# Patient Record
Sex: Male | Born: 1975 | Hispanic: No | Marital: Single | Smoking: Never smoker
Health system: Southern US, Community
[De-identification: ages and names within clinical notes are randomized; demographics above are authoritative.]

## PROBLEM LIST (undated history)

## (undated) DIAGNOSIS — K439 Ventral hernia without obstruction or gangrene: Secondary | ICD-10-CM

## (undated) DIAGNOSIS — K760 Fatty (change of) liver, not elsewhere classified: Secondary | ICD-10-CM

## (undated) DIAGNOSIS — M545 Low back pain, unspecified: Secondary | ICD-10-CM

## (undated) DIAGNOSIS — R109 Unspecified abdominal pain: Secondary | ICD-10-CM

## (undated) HISTORY — DX: Low back pain: M54.5

## (undated) HISTORY — DX: Ventral hernia without obstruction or gangrene: K43.9

## (undated) HISTORY — DX: Fatty (change of) liver, not elsewhere classified: K76.0

## (undated) HISTORY — DX: Unspecified abdominal pain: R10.9

## (undated) HISTORY — DX: Low back pain, unspecified: M54.50

---

## 1999-08-27 ENCOUNTER — Emergency Department (HOSPITAL_COMMUNITY): Admission: EM | Admit: 1999-08-27 | Discharge: 1999-08-27 | Payer: Self-pay | Admitting: Emergency Medicine

## 1999-09-24 ENCOUNTER — Emergency Department (HOSPITAL_COMMUNITY): Admission: EM | Admit: 1999-09-24 | Discharge: 1999-09-24 | Payer: Self-pay | Admitting: Emergency Medicine

## 2000-07-20 ENCOUNTER — Emergency Department (HOSPITAL_COMMUNITY): Admission: EM | Admit: 2000-07-20 | Discharge: 2000-07-20 | Payer: Self-pay | Admitting: Emergency Medicine

## 2002-05-21 ENCOUNTER — Emergency Department (HOSPITAL_COMMUNITY): Admission: EM | Admit: 2002-05-21 | Discharge: 2002-05-21 | Payer: Self-pay | Admitting: *Deleted

## 2002-05-28 ENCOUNTER — Emergency Department (HOSPITAL_COMMUNITY): Admission: EM | Admit: 2002-05-28 | Discharge: 2002-05-28 | Payer: Self-pay | Admitting: Emergency Medicine

## 2007-05-20 ENCOUNTER — Emergency Department (HOSPITAL_COMMUNITY): Admission: EM | Admit: 2007-05-20 | Discharge: 2007-05-20 | Payer: Self-pay | Admitting: Emergency Medicine

## 2007-05-27 ENCOUNTER — Emergency Department (HOSPITAL_COMMUNITY): Admission: EM | Admit: 2007-05-27 | Discharge: 2007-05-27 | Payer: Self-pay | Admitting: Emergency Medicine

## 2012-01-28 ENCOUNTER — Ambulatory Visit (INDEPENDENT_AMBULATORY_CARE_PROVIDER_SITE_OTHER): Payer: BC Managed Care – PPO

## 2012-01-28 ENCOUNTER — Other Ambulatory Visit: Payer: Self-pay | Admitting: Emergency Medicine

## 2012-01-28 ENCOUNTER — Ambulatory Visit (HOSPITAL_COMMUNITY)
Admission: RE | Admit: 2012-01-28 | Discharge: 2012-01-28 | Disposition: A | Discharge status: Home / Self Care | Payer: BC Managed Care – PPO | Source: Ambulatory Visit | Attending: Emergency Medicine | Admitting: Emergency Medicine

## 2012-01-28 DIAGNOSIS — K439 Ventral hernia without obstruction or gangrene: Secondary | ICD-10-CM | POA: Insufficient documentation

## 2012-01-28 DIAGNOSIS — R1084 Generalized abdominal pain: Secondary | ICD-10-CM

## 2012-01-28 DIAGNOSIS — K37 Unspecified appendicitis: Secondary | ICD-10-CM

## 2012-01-28 DIAGNOSIS — K7689 Other specified diseases of liver: Secondary | ICD-10-CM | POA: Insufficient documentation

## 2012-01-28 DIAGNOSIS — Z0000 Encounter for general adult medical examination without abnormal findings: Secondary | ICD-10-CM

## 2012-01-28 DIAGNOSIS — J9819 Other pulmonary collapse: Secondary | ICD-10-CM | POA: Insufficient documentation

## 2012-01-28 MED ORDER — IOHEXOL 300 MG/ML  SOLN
100.0000 mL | Freq: Once | INTRAMUSCULAR | Status: AC | PRN
  Administered 2012-01-28: 100 mL via INTRAVENOUS

## 2012-01-30 ENCOUNTER — Telehealth: Payer: Self-pay

## 2012-01-30 NOTE — Telephone Encounter (Signed)
Spoke with pts wife and explained results and instructions. Wife verbalized understanding.

## 2012-01-30 NOTE — Telephone Encounter (Signed)
Patient wife Theadora Rama is calling about her husband lab work would like for someone to call concerning the results.

## 2012-02-01 ENCOUNTER — Encounter (INDEPENDENT_AMBULATORY_CARE_PROVIDER_SITE_OTHER): Payer: Self-pay | Admitting: Surgery

## 2012-02-03 ENCOUNTER — Ambulatory Visit (INDEPENDENT_AMBULATORY_CARE_PROVIDER_SITE_OTHER): Payer: BC Managed Care – PPO | Admitting: Emergency Medicine

## 2012-02-03 DIAGNOSIS — E1165 Type 2 diabetes mellitus with hyperglycemia: Secondary | ICD-10-CM | POA: Insufficient documentation

## 2012-02-03 DIAGNOSIS — R1033 Periumbilical pain: Secondary | ICD-10-CM

## 2012-02-03 DIAGNOSIS — R1084 Generalized abdominal pain: Secondary | ICD-10-CM

## 2012-02-03 DIAGNOSIS — E782 Mixed hyperlipidemia: Secondary | ICD-10-CM

## 2012-02-03 DIAGNOSIS — K439 Ventral hernia without obstruction or gangrene: Secondary | ICD-10-CM

## 2012-02-03 DIAGNOSIS — K760 Fatty (change of) liver, not elsewhere classified: Secondary | ICD-10-CM

## 2012-02-03 LAB — POCT URINALYSIS DIPSTICK
Glucose, UA: 100
Leukocytes, UA: NEGATIVE
Nitrite, UA: NEGATIVE
Urobilinogen, UA: 0.2

## 2012-02-03 LAB — POCT CBC
Granulocyte percent: 50 %G (ref 37–80)
Hemoglobin: 16 g/dL (ref 14.1–18.1)
MID (cbc): 0.5 (ref 0–0.9)
MPV: 8.7 fL (ref 0–99.8)
POC MID %: 6.4 %M (ref 0–12)
Platelet Count, POC: 330 10*3/uL (ref 142–424)
RBC: 5.52 M/uL (ref 4.69–6.13)

## 2012-02-03 LAB — LIPID PANEL
HDL: 47 mg/dL (ref >39)
Total CHOL/HDL Ratio: 4.7 Ratio
Triglycerides: 105 mg/dL (ref <150)

## 2012-02-03 LAB — GLUCOSE, POCT (MANUAL RESULT ENTRY): POC Glucose: 224

## 2012-02-03 LAB — POCT UA - MICROSCOPIC ONLY: Crystals, Ur, HPF, POC: NEGATIVE

## 2012-02-03 NOTE — Patient Instructions (Addendum)
Diabetes, Type 2Place hyperlipidemia patient instructions here.  Diabetes is a long-lasting (chronic) disease. In type 2 diabetes, the pancreas does not make enough insulin (a hormone), and the body does not respond normally to the insulin that is made. This type of diabetes was also previously called adult-onset diabetes. It usually occurs after the age of 30, but it can occur at any age.  CAUSES  Type 2 diabetes happens because the pancreasis not making enough insulin or your body has trouble using the insulin that your pancreas does make properly. SYMPTOMS   Drinking more than usual.   Urinating more than usual.   Blurred vision.   Dry, itchy skin.   Frequent infections.   Feeling more tired than usual (fatigue).  DIAGNOSIS The diagnosis of type 2 diabetes is usually made by one of the following tests:  Fasting blood glucose test. You will not eat for at least 8 hours and then take a blood test.   Random blood glucose test. Your blood glucose (sugar) is checked at any time of the day regardless of when you ate.   Oral glucose tolerance test (OGTT). Your blood glucose is measured after you have not eaten (fasted) and then after you drink a glucose containing beverage.  TREATMENT   Healthy eating.   Exercise.   Medicine, if needed.   Monitoring blood glucose.   Seeing your caregiver regularly.  HOME CARE INSTRUCTIONS   Check your blood glucose at least once a day. More frequent monitoring may be necessary, depending on your medicines and on how well your diabetes is controlled. Your caregiver will advise you.   Take your medicine as directed by your caregiver.   Do not smoke.   Make wise food choices. Ask your caregiver for information. Weight loss can improve your diabetes.   Learn about low blood glucose (hypoglycemia) and how to treat it.   Get your eyes checked regularly.   Have a yearly physical exam. Have your blood pressure checked and your blood and urine  tested.   Wear a pendant or bracelet saying that you have diabetes.   Check your feet every night for cuts, sores, blisters, and redness. Let your caregiver know if you have any problems.  SEEK MEDICAL CARE IF:   You have problems keeping your blood glucose in target range.   You have problems with your medicines.   You have symptoms of an illness that do not improve after 24 hours.  You have a sore or wound that is not healing. Cholesterol Cholesterol is a white, waxy, fat-like protein needed by your body in small amounts. The liver makes all the cholesterol you need. It is carried from the liver by the blood through the blood vessels. Deposits (plaque) may build up on blood vessel walls. This makes the arteries narrower and stiffer. Plaque increases the risk for heart attack and stroke. You cannot feel your cholesterol level even if it is very high. The only way to know is by a blood test to check your lipid (fats) levels. Once you know your cholesterol levels, you should keep a record of the test results. Work with your caregiver to to keep your levels in the desired range. WHAT THE RESULTS MEAN: Total cholesterol is a rough measure of all the cholesterol in your blood.  LDL is the so-called bad cholesterol. This is the type that deposits cholesterol in the walls of the arteries. You want this level to be low.  HDL is the good cholesterol  because it cleans the arteries and carries the LDL away. You want this level to be high.  Triglycerides are fat that the body can either burn for energy or store. High levels are closely linked to heart disease.  DESIRED LEVELS: Total cholesterol below 200.  LDL below 100 for people at risk, below 70 for very high risk.  HDL above 50 is good, above 60 is best.  Triglycerides below 150.  HOW TO LOWER YOUR CHOLESTEROL: Diet.  Choose fish or white meat chicken and [place], roasted or baked. Limit fatty cuts of red meat, fried foods, and processed meats,  such as sausage and lunch meat.  Eat lots of fresh fruits and vegetables. Choose whole grains, beans, pasta, potatoes and cereals.  Use only small amounts of olive, corn or canola oils. Avoid butter, mayonnaise, shortening or palm kernel oils. Avoid foods with trans-fats.  Use skim/nonfat milk and low-fat/nonfat yogurt and cheeses. Avoid whole milk, cream, ice cream, egg yolks and cheeses. Healthy desserts include angel food cake, ginger snaps, animal crackers, hard candy, popsicles, and low-fat/nonfat frozen yogurt. Avoid pastries, cakes, pies and cookies.  Exercise.  A regular program helps decrease LDL and raises HDL.  Helps with weight control.  Do things that increase your activity level like gardening, walking, or taking the stairs.  Medication.  May be prescribed by your caregiver to help lowering cholesterol and the risk for heart disease.  You may need medicine even if your levels are normal if you have several risk factors.  HOME CARE INSTRUCTIONS  Follow your diet and exercise programs as suggested by your caregiver.  Take medications as directed.  Have blood work done when your caregiver feels it is necessary.  MAKE SURE YOU:  Understand these instructions.  Will watch your condition.  Will get help right away if you are not doing well or get worse.  Document Released: 09/11/2001 Document Revised: 08/29/2011 Document Reviewed: 03/03/2008  [facility] Patient Information 2012 [place], [place].  You notice a change in vision or a new problem with your vision.   You have a fever.  MAKE SURE YOU:  Understand these instructions.   Will watch your condition.   Will get help right away if you are not doing well or get worse.  Document Released: 12/17/2005 Document Revised: 08/30/2011 Document Reviewed: 06/04/2011 [facility] Patient Information 2012 [place], [place].Place hyperlipidemia patient instructions here.

## 2012-02-03 NOTE — Progress Notes (Signed)
  Subjective:    Patient ID: Dennis Thomas, male    DOB: [birth year], 36 y.o.   MRN: [id number]  HPIThe patient enters for followup of ventral hernia and recent diagnosis of diabetes    Review of Systems  Constitutional: Negative.   Respiratory: Negative.   Cardiovascular: Positive for chest pain.  Gastrointestinal: Negative for abdominal pain, blood in stool and abdominal distention.  No vision changes positive urinary frequency and fatigue     Objective:   Physical Exam  Cardiovascular: Normal rate.   Pulmonary/Chest: Effort normal.  Abdominal: Soft. He exhibits no distension and no mass. There is no tenderness. There is no rebound and no guarding.          Assessment & Plan:  Here to f/u dm and hernia ventral and fatty change

## 2012-02-06 ENCOUNTER — Telehealth: Payer: Self-pay

## 2012-02-06 NOTE — Telephone Encounter (Signed)
.  [facility]   PT IS CALLING FOR LAB RESULTS  PLEASE CALL [phone]

## 2012-02-12 ENCOUNTER — Ambulatory Visit (INDEPENDENT_AMBULATORY_CARE_PROVIDER_SITE_OTHER): Payer: BC Managed Care – PPO | Admitting: Surgery

## 2012-02-12 ENCOUNTER — Encounter (INDEPENDENT_AMBULATORY_CARE_PROVIDER_SITE_OTHER): Payer: Self-pay | Admitting: Surgery

## 2012-02-12 ENCOUNTER — Telehealth: Payer: Self-pay

## 2012-02-12 VITALS — BP 120/84 | HR 76 | Temp 98.4°F | Resp 18 | Ht 67.0 in | Wt 219.2 lb

## 2012-02-12 DIAGNOSIS — K439 Ventral hernia without obstruction or gangrene: Secondary | ICD-10-CM | POA: Insufficient documentation

## 2012-02-12 NOTE — Progress Notes (Signed)
Patient ID: Dennis Thomas, male   DOB: 02/20/1976, 36 y.o.   MRN: [id number]  Chief Complaint  Patient presents with  . Hernia    new pt- eval ventral hernia    HPI Dennis Thomas is a 36 y.o. male.  Referred by Dr. Cleta Alberts for evaluation of a small supraumbilical ventral hernia HPI This is a 36 yo male who recently began experiencing some pain just above his umbilicus protruding to the right.  His wife has noticed a slight swelling in this area.  He recently had a physical examination which revealed a diagnosis of DM2.  Dr. Cleta Alberts obtained a CT scan which showed some fatty liver, as well as a small fat-containing ventral hernia just above the umbilicus.  He is referred for surgical evaluation. Past Medical History  Diagnosis Date  . Lower back pain   . Pain in the abdomen   . Diabetes mellitus   . Ventral hernia   . Steatosis of liver     History reviewed. No pertinent past surgical history.  History reviewed. No pertinent family history.  Social History History  Substance Use Topics  . Smoking status: Never Smoker   . Smokeless tobacco: Not on file  . Alcohol Use: No    No Known Allergies  Current Outpatient Prescriptions  Medication Sig Dispense Refill  . metFORMIN (GLUCOPHAGE) 500 MG tablet Take 500 mg by mouth daily with breakfast.        Review of Systems Review of Systems  Constitutional: Negative for fever, chills and unexpected weight change.  HENT: Negative for hearing loss, congestion, sore throat, trouble swallowing and voice change.   Eyes: Negative for visual disturbance.  Respiratory: Negative for cough and wheezing.   Cardiovascular: Negative for chest pain, palpitations and leg swelling.  Gastrointestinal: Positive for abdominal pain. Negative for nausea, vomiting, diarrhea, constipation, blood in stool, abdominal distention, anal bleeding and rectal pain.  Genitourinary: Negative for hematuria and difficulty urinating.  Musculoskeletal: Negative for  arthralgias.  Skin: Negative for rash and wound.  Neurological: Negative for seizures, syncope, weakness and headaches.  Hematological: Negative for adenopathy. Does not bruise/bleed easily.  Psychiatric/Behavioral: Negative for confusion.    Blood pressure 120/84, pulse 76, temperature 98.4 F (36.9 C), temperature source Temporal, resp. rate 18, height 5\' 7"  (1.702 m), weight 219 lb 3.2 oz (99.428 kg).  Physical Exam Physical Exam WDWN in NAD HEENT:  EOMI, sclera anicteric Neck:  No masses, no thyromegaly Lungs:  CTA bilaterally; normal respiratory effort CV:  Regular rate and rhythm; no murmurs Abd:  +bowel sounds, soft, non-tender, just above the umbilicus, there is a 2 cm palpable subcutaneous mass - mildly tender to palpation Ext:  Well-perfused; no edema Skin:  Warm, dry; no sign of jaundice  Data Reviewed CT scan  Assessment    Small supraumbilical ventral hernia    Plan    Ventral hernia repair, possibly with mesh.  The surgical procedure has been discussed with the patient.  Potential risks, benefits, alternative treatments, and expected outcomes have been explained.  All of the patient's questions at this time have been answered.  The likelihood of reaching the patient's treatment goal is good.  The patient understand the proposed surgical procedure and wishes to proceed.        Rayssa Atha K. 02/12/2012, 5:11 PM

## 2012-02-12 NOTE — Telephone Encounter (Signed)
Dr. Cleta Alberts- Pt had lab work done about 2 weeks ago and has not heard back about status of those. Went to another doctor and was told he had high cholesterol. Pt is very concerned about this.

## 2012-02-12 NOTE — Telephone Encounter (Signed)
Spoke with patients wife, per lab results we will get BS under better control, and recheck lipids in 3 mths.  If they are elevated at that time, will consider statin.  Wife understands.

## 2012-02-12 NOTE — Patient Instructions (Signed)
We will schedule your surgery today. 

## 2012-02-20 ENCOUNTER — Other Ambulatory Visit: Payer: Self-pay | Admitting: Physician Assistant

## 2012-02-20 HISTORY — PX: HERNIA REPAIR: SHX51

## 2012-03-12 ENCOUNTER — Encounter (INDEPENDENT_AMBULATORY_CARE_PROVIDER_SITE_OTHER): Payer: Self-pay

## 2012-03-21 ENCOUNTER — Encounter (HOSPITAL_COMMUNITY): Payer: Self-pay | Admitting: Pharmacy Technician

## 2012-03-21 ENCOUNTER — Other Ambulatory Visit (HOSPITAL_COMMUNITY): Payer: BC Managed Care – PPO

## 2012-03-24 ENCOUNTER — Other Ambulatory Visit: Payer: Self-pay

## 2012-03-24 ENCOUNTER — Encounter (HOSPITAL_COMMUNITY): Payer: Self-pay

## 2012-03-24 ENCOUNTER — Encounter (HOSPITAL_COMMUNITY)
Admission: RE | Admit: 2012-03-24 | Discharge: 2012-03-24 | Disposition: A | Discharge status: Home / Self Care | Payer: BC Managed Care – PPO | Source: Ambulatory Visit | Attending: Surgery | Admitting: Surgery

## 2012-03-24 LAB — BASIC METABOLIC PANEL
GFR calc Af Amer: >90 mL/min (ref >90)
GFR calc non Af Amer: >90 mL/min (ref >90)
Glucose, Bld: 128 mg/dL — ABNORMAL HIGH (ref 70–99)
Potassium: 4.1 mEq/L (ref 3.5–5.1)
Sodium: 138 mEq/L (ref 135–145)

## 2012-03-24 LAB — CBC
Hemoglobin: 16.1 g/dL (ref 13.0–17.0)
MCHC: 34.1 g/dL (ref 30.0–36.0)
RBC: 5.49 MIL/uL (ref 4.22–5.81)
WBC: 8.2 10*3/uL (ref 4.0–10.5)

## 2012-03-24 LAB — SURGICAL PCR SCREEN
MRSA, PCR: NEGATIVE
Staphylococcus aureus: POSITIVE — AB

## 2012-03-24 NOTE — Patient Instructions (Signed)
21 Dennis Thomas  03/24/2012   Your procedure is scheduled on:  03/26/12  Report to SHORT STAY DEPT  at 6:00 AM.  Call this number if you have problems the morning of surgery: [phone]   Remember:   Do not eat food or drink liquids AFTER MIDNIGHT    Take these medicines the morning of surgery with A SIP OF WATER: NONE   Do not wear jewelry, make-up or nail polish.  Do not wear lotions, powders, or perfumes.   Do not shave legs or underarms 48 hrs. before surgery (men may shave face)  Do not bring valuables to the hospital.  Contacts, dentures or bridgework may not be worn into surgery.  Leave suitcase in the car. After surgery it may be brought to your room.  For patients admitted to the hospital, checkout time is 11:00 AM the day of discharge.   Patients discharged the day of surgery will not be allowed to drive home.  Name and phone number of your driver:   Special Instructions:   Please read over the following fact sheets that you were given: MRSA  Information               SHOWER WITH BETASEPT THE NIGHT BEFORE SURGERY AND THE MORNING OF SURGERY            STOP ALL ASPIRIN AND HERBAL PRODUCTS 5 DAYS PREOP

## 2012-03-25 ENCOUNTER — Other Ambulatory Visit: Payer: Self-pay | Admitting: Physician Assistant

## 2012-03-25 ENCOUNTER — Other Ambulatory Visit (HOSPITAL_COMMUNITY): Payer: BC Managed Care – PPO

## 2012-03-25 NOTE — H&P (Signed)
Chief Complaint   Patient presents with   .  Hernia       new pt- eval ventral hernia        HPI Dennis Thomas is a 36 y.o. male.  Referred by Dr. Cleta Alberts for evaluation of a small supraumbilical ventral hernia HPI This is a 36 yo male who recently began experiencing some pain just above his umbilicus protruding to the right.  His wife has noticed a slight swelling in this area.  He recently had a physical examination which revealed a diagnosis of DM2.  Dr. Cleta Alberts obtained a CT scan which showed some fatty liver, as well as a small fat-containing ventral hernia just above the umbilicus.  He is referred for surgical evaluation. Past Medical History   Diagnosis  Date   .  Lower back pain     .  Pain in the abdomen     .  Diabetes mellitus     .  Ventral hernia     .  Steatosis of liver          History reviewed. No pertinent past surgical history.   History reviewed. No pertinent family history.   Social History History   Substance Use Topics   .  Smoking status:  Never Smoker    .  Smokeless tobacco:  Not on file   .  Alcohol Use:  No        No Known Allergies    Current Outpatient Prescriptions   Medication  Sig  Dispense  Refill   .  metFORMIN (GLUCOPHAGE) 500 MG tablet  Take 500 mg by mouth daily with breakfast.              Review of Systems Review of Systems  Constitutional: Negative for fever, chills and unexpected weight change.  HENT: Negative for hearing loss, congestion, sore throat, trouble swallowing and voice change.   Eyes: Negative for visual disturbance.  Respiratory: Negative for cough and wheezing.   Cardiovascular: Negative for chest pain, palpitations and leg swelling.  Gastrointestinal: Positive for abdominal pain. Negative for nausea, vomiting, diarrhea, constipation, blood in stool, abdominal distention, anal bleeding and rectal pain.  Genitourinary: Negative for hematuria and difficulty urinating.  Musculoskeletal: Negative for  arthralgias.  Skin: Negative for rash and wound.  Neurological: Negative for seizures, syncope, weakness and headaches.  Hematological: Negative for adenopathy. Does not bruise/bleed easily.  Psychiatric/Behavioral: Negative for confusion.      Blood pressure 120/84, pulse 76, temperature 98.4 F (36.9 C), temperature source Temporal, resp. rate 18, height 5\' 7"  (1.702 m), weight 219 lb 3.2 oz (99.428 kg).   Physical Exam Physical Exam WDWN in NAD HEENT:  EOMI, sclera anicteric Neck:  No masses, no thyromegaly Lungs:  CTA bilaterally; normal respiratory effort CV:  Regular rate and rhythm; no murmurs Abd:  +bowel sounds, soft, non-tender, just above the umbilicus, there is a 2 cm palpable subcutaneous mass - mildly tender to palpation Ext:  Well-perfused; no edema Skin:  Warm, dry; no sign of jaundice   Data Reviewed CT scan   Assessment    Small supraumbilical ventral hernia     Plan    Ventral hernia repair, possibly with mesh.  The surgical procedure has been discussed with the patient.  Potential risks, benefits, alternative treatments, and expected outcomes have been explained.  All of the patient's questions at this time have been answered.  The likelihood of reaching the patient's treatment goal is good.  The  patient understand the proposed surgical procedure and wishes to proceed.      Wilmon Arms. Corliss Skains, MD, [facility]  03/25/2012 9:41 PM

## 2012-03-26 ENCOUNTER — Encounter (HOSPITAL_COMMUNITY): Payer: Self-pay | Admitting: Anesthesiology

## 2012-03-26 ENCOUNTER — Encounter (HOSPITAL_COMMUNITY)
Admission: RE | Disposition: A | Discharge status: Home / Self Care | Payer: Self-pay | Source: Ambulatory Visit | Attending: Surgery

## 2012-03-26 ENCOUNTER — Encounter (HOSPITAL_COMMUNITY): Payer: Self-pay | Admitting: *Deleted

## 2012-03-26 ENCOUNTER — Ambulatory Visit (HOSPITAL_COMMUNITY): Payer: BC Managed Care – PPO | Admitting: Anesthesiology

## 2012-03-26 ENCOUNTER — Ambulatory Visit (HOSPITAL_COMMUNITY)
Admission: RE | Admit: 2012-03-26 | Discharge: 2012-03-26 | Disposition: A | Discharge status: Home / Self Care | Payer: BC Managed Care – PPO | Source: Ambulatory Visit | Attending: Surgery | Admitting: Surgery

## 2012-03-26 DIAGNOSIS — K439 Ventral hernia without obstruction or gangrene: Secondary | ICD-10-CM

## 2012-03-26 DIAGNOSIS — Z01812 Encounter for preprocedural laboratory examination: Secondary | ICD-10-CM | POA: Insufficient documentation

## 2012-03-26 DIAGNOSIS — E119 Type 2 diabetes mellitus without complications: Secondary | ICD-10-CM | POA: Insufficient documentation

## 2012-03-26 HISTORY — PX: VENTRAL HERNIA REPAIR: SHX424

## 2012-03-26 SURGERY — REPAIR, HERNIA, VENTRAL
Anesthesia: General | Wound class: Clean

## 2012-03-26 MED ORDER — OXYCODONE-ACETAMINOPHEN 5-325 MG PO TABS: 1.0000 | ORAL_TABLET | ORAL | Status: AC | PRN

## 2012-03-26 MED ORDER — ACETAMINOPHEN 10 MG/ML IV SOLN
INTRAVENOUS | Status: DC | PRN
  Administered 2012-03-26: 1000 mg via INTRAVENOUS

## 2012-03-26 MED ORDER — PROPOFOL 10 MG/ML IV EMUL
INTRAVENOUS | Status: DC | PRN
  Administered 2012-03-26: 140 mg via INTRAVENOUS

## 2012-03-26 MED ORDER — FENTANYL CITRATE 0.05 MG/ML IJ SOLN
INTRAMUSCULAR | Status: DC | PRN
  Administered 2012-03-26: 100 ug via INTRAVENOUS
  Administered 2012-03-26: 50 ug via INTRAVENOUS

## 2012-03-26 MED ORDER — DROPERIDOL 2.5 MG/ML IJ SOLN
INTRAMUSCULAR | Status: DC | PRN
  Administered 2012-03-26: 0.625 mg via INTRAVENOUS

## 2012-03-26 MED ORDER — OXYCODONE-ACETAMINOPHEN 5-325 MG PO TABS
ORAL_TABLET | ORAL | Status: AC
  Filled 2012-03-26: qty 1

## 2012-03-26 MED ORDER — LACTATED RINGERS IV SOLN: INTRAVENOUS | Status: DC

## 2012-03-26 MED ORDER — ACETAMINOPHEN 10 MG/ML IV SOLN
INTRAVENOUS | Status: AC
  Filled 2012-03-26: qty 100

## 2012-03-26 MED ORDER — BUPIVACAINE-EPINEPHRINE PF 0.25-1:200000 % IJ SOLN
INTRAMUSCULAR | Status: AC
  Filled 2012-03-26: qty 30

## 2012-03-26 MED ORDER — HYDROMORPHONE HCL PF 1 MG/ML IJ SOLN: 0.2500 mg | INTRAMUSCULAR | Status: DC | PRN

## 2012-03-26 MED ORDER — MIDAZOLAM HCL 5 MG/5ML IJ SOLN
INTRAMUSCULAR | Status: DC | PRN
  Administered 2012-03-26: 2 mg via INTRAVENOUS

## 2012-03-26 MED ORDER — BUPIVACAINE-EPINEPHRINE 0.25% -1:200000 IJ SOLN
INTRAMUSCULAR | Status: DC | PRN
  Administered 2012-03-26: 20 mL

## 2012-03-26 MED ORDER — OXYCODONE-ACETAMINOPHEN 5-325 MG PO TABS
1.0000 | ORAL_TABLET | ORAL | Status: DC | PRN
  Administered 2012-03-26: 1 via ORAL

## 2012-03-26 MED ORDER — CEFAZOLIN SODIUM 1-5 GM-% IV SOLN
INTRAVENOUS | Status: AC
  Filled 2012-03-26: qty 100

## 2012-03-26 MED ORDER — MORPHINE SULFATE 10 MG/ML IJ SOLN: 2.0000 mg | INTRAMUSCULAR | Status: DC | PRN

## 2012-03-26 MED ORDER — 0.9 % SODIUM CHLORIDE (POUR BTL) OPTIME
TOPICAL | Status: DC | PRN
  Administered 2012-03-26: 1000 mL

## 2012-03-26 MED ORDER — CEFAZOLIN SODIUM-DEXTROSE 2-3 GM-% IV SOLR
2.0000 g | INTRAVENOUS | Status: AC
  Administered 2012-03-26: 2 g via INTRAVENOUS

## 2012-03-26 MED ORDER — ONDANSETRON HCL 4 MG/2ML IJ SOLN
INTRAMUSCULAR | Status: DC | PRN
  Administered 2012-03-26: 4 mg via INTRAVENOUS

## 2012-03-26 MED ORDER — DEXAMETHASONE SODIUM PHOSPHATE 10 MG/ML IJ SOLN
INTRAMUSCULAR | Status: DC | PRN
  Administered 2012-03-26: 10 mg via INTRAVENOUS

## 2012-03-26 MED ORDER — ONDANSETRON HCL 4 MG/2ML IJ SOLN: 4.0000 mg | INTRAMUSCULAR | Status: DC | PRN

## 2012-03-26 MED ORDER — LACTATED RINGERS IV SOLN
INTRAVENOUS | Status: DC | PRN
  Administered 2012-03-26: 08:00:00 via INTRAVENOUS

## 2012-03-26 SURGICAL SUPPLY — 43 items
APL SKNCLS STERI-STRIP NONHPOA (GAUZE/BANDAGES/DRESSINGS) ×1
BENZOIN TINCTURE PRP APPL 2/3 (GAUZE/BANDAGES/DRESSINGS) ×1 IMPLANT
BINDER ABD UNIV 12 45-62 (WOUND CARE) IMPLANT
BINDER ABDOMINAL 46IN 62IN (WOUND CARE)
BLADE HEX COATED 2.75 (ELECTRODE) ×2 IMPLANT
CANISTER SUCTION 2500CC (MISCELLANEOUS) ×2 IMPLANT
CLOTH BEACON ORANGE TIMEOUT ST (SAFETY) ×2 IMPLANT
DECANTER SPIKE VIAL GLASS SM (MISCELLANEOUS) IMPLANT
DRAIN CHANNEL 10F 3/8 F FF (DRAIN) IMPLANT
DRAIN CHANNEL RND F F (WOUND CARE) IMPLANT
DRAPE LAPAROSCOPIC ABDOMINAL (DRAPES) ×2 IMPLANT
DRAPE UTILITY XL STRL (DRAPES) ×2 IMPLANT
DRSG TEGADERM 4X4.75 (GAUZE/BANDAGES/DRESSINGS) ×1 IMPLANT
ELECT REM PT RETURN 9FT ADLT (ELECTROSURGICAL) ×2
ELECTRODE REM PT RTRN 9FT ADLT (ELECTROSURGICAL) ×1 IMPLANT
EVACUATOR SILICONE 100CC (DRAIN) IMPLANT
GLOVE BIO SURGEON STRL SZ7 (GLOVE) ×2 IMPLANT
GLOVE BIOGEL PI IND STRL 7.0 (GLOVE) ×1 IMPLANT
GLOVE BIOGEL PI IND STRL 7.5 (GLOVE) ×1 IMPLANT
GLOVE BIOGEL PI INDICATOR 7.0 (GLOVE) ×1
GLOVE BIOGEL PI INDICATOR 7.5 (GLOVE) ×1
GOWN STRL NON-REIN LRG LVL3 (GOWN DISPOSABLE) ×3 IMPLANT
GOWN STRL REIN XL XLG (GOWN DISPOSABLE) ×2 IMPLANT
KIT BASIN OR (CUSTOM PROCEDURE TRAY) ×2 IMPLANT
NEEDLE HYPO 22GX1.5 SAFETY (NEEDLE) ×1 IMPLANT
PACK GENERAL/GYN (CUSTOM PROCEDURE TRAY) ×2 IMPLANT
SPONGE GAUZE 4X4 12PLY (GAUZE/BANDAGES/DRESSINGS) ×2 IMPLANT
STAPLER VISISTAT 35W (STAPLE) ×2 IMPLANT
STRIP CLOSURE SKIN 1/2X4 (GAUZE/BANDAGES/DRESSINGS) ×1 IMPLANT
SUT ETHILON 2 0 PS N (SUTURE) ×2 IMPLANT
SUT NOVA NAB GS-21 0 18 T12 DT (SUTURE) ×2 IMPLANT
SUT PDS AB 1 CTX 36 (SUTURE) IMPLANT
SUT PROLENE 0 CT 1 CR/8 (SUTURE) IMPLANT
SUT SILK 3 0 (SUTURE)
SUT SILK 3-0 18XBRD TIE 12 (SUTURE) ×1 IMPLANT
SUT VIC AB 2-0 CT1 27 (SUTURE) ×2
SUT VIC AB 2-0 CT1 27XBRD (SUTURE) ×1 IMPLANT
SUT VIC AB 3-0 SH 27 (SUTURE) ×2
SUT VIC AB 3-0 SH 27XBRD (SUTURE) ×1 IMPLANT
SYR CONTROL 10ML LL (SYRINGE) ×1 IMPLANT
TAPE CLOTH SURG 4X10 WHT LF (GAUZE/BANDAGES/DRESSINGS) ×1 IMPLANT
TOWEL OR 17X26 10 PK STRL BLUE (TOWEL DISPOSABLE) ×3 IMPLANT
TRAY FOLEY CATH 14FRSI W/METER (CATHETERS) IMPLANT

## 2012-03-26 NOTE — Interval H&P Note (Signed)
History and Physical Interval Note:  03/26/2012 8:31 AM  Dennis Thomas  has presented today for surgery, with the diagnosis of Small supraumbilical ventral hernia  The various methods of treatment have been discussed with the patient and family. After consideration of risks, benefits and other options for treatment, the patient has consented to  Procedure(s) (LRB): HERNIA REPAIR VENTRAL ADULT (N/A) INSERTION OF MESH (N/A) as a surgical intervention .  The patients' history has been reviewed, patient examined, no change in status, stable for surgery.  I have reviewed the patients' chart and labs.  Questions were answered to the patient's satisfaction.     Decorian Schuenemann K.

## 2012-03-26 NOTE — Op Note (Signed)
Ventral Hernia Repair Procedure Note  Indications: Symptomatic ventral hernia  Pre-operative Diagnosis: Ventral hernia  Post-operative Diagnosis: Ventral hernia  Surgeon: Elazar Argabright K.   Assistants: none  Anesthesia: General LMA anesthesia  ASA Class: 2   Procedure Details  The patient was seen in the Holding Room. The risks, benefits, complications, treatment options, and expected outcomes were discussed with the patient. The possibilities of reaction to medication, pulmonary aspiration, perforation of viscus, bleeding, recurrent infection, the need for additional procedures, failure to diagnose a condition, and creating a complication requiring transfusion or operation were discussed with the patient. The patient concurred with the proposed plan, giving informed consent.  The site of surgery properly noted/marked. The patient was taken to the operating room, identified as Dennis Thomas and the procedure verified as ventral hernia repair. A Time Out was held and the above information confirmed.  The patient was placed supine.  After establishing general anesthesia, the abdomen was prepped with Chloraprep and draped in sterile fashion.  We made a vertical incision over the palpable hernia which was just above the umbilicus.  The incision was about 3 cm long.  . Dissection was carried down to the hernia sac located above the fascia and mobilized from surrounding structures.  Intact fascia was identified circumferentially around the defect. The defect was only about 7 mm across.  We amputated the herniated preperitoneal fat. Since the defect was so small, we chose to primarily close this with sutures.  The fascial defect was reapproximated with interrupted figure-of-8  0 Novofil sutures.  The subcutaneous tissues were irrigated. We closed the subcutaneous tissues with 3-0 Vicryl and closed the incision with 4-0 Monocryl.  Steri-strips and clean dressings were applied.  Instrument, sponge, and  needle counts were correct prior to closure and at the conclusion of the case.   Findings: 7 mm ventral hernia defect  Estimated Blood Loss:  Minimal         Drains: none                      Complications:  None; patient tolerated the procedure well.         Disposition: PACU - hemodynamically stable.         Condition: stable   Wilmon Arms. Corliss Skains, MD, [facility]  03/26/2012 9:24 AM

## 2012-03-26 NOTE — Progress Notes (Signed)
Pt given ice pack at discharge 

## 2012-03-26 NOTE — Progress Notes (Signed)
Pt ambulated in hall to bathroom without any difficulty.  Pt voided clear yellow urine.

## 2012-03-26 NOTE — Discharge Instructions (Signed)
[facility] ° °VENTRAL HERNIA REPAIR: POST OP INSTRUCTIONS ° °Always review your discharge instruction sheet given to you by the facility where your surgery was performed. °IF YOU HAVE DISABILITY OR FAMILY LEAVE FORMS, YOU MUST BRING THEM TO THE OFFICE FOR PROCESSING.   °DO NOT GIVE THEM TO YOUR DOCTOR. ° °1. A  prescription for pain medication may be given to you upon discharge.  Take your pain medication as prescribed, if needed.  If narcotic pain medicine is not needed, then you may take acetaminophen (Tylenol) or ibuprofen (Advil) as needed. °2. Take your usually prescribed medications unless otherwise directed. °3. If you need a refill on your pain medication, please contact your pharmacy.  They will contact our office to request authorization. Prescriptions will not be filled after 5 pm or on week-ends. °4. You should follow a light diet the first 24 hours after arrival home, such as soup and crackers, etc.  Be sure to include lots of fluids daily.  Resume your normal diet the day after surgery. °5. Most patients will experience some swelling and bruising around the umbilicus or in the groin and scrotum.  Ice packs and reclining will help.  Swelling and bruising can take several days to resolve.  °6. It is common to experience some constipation if taking pain medication after surgery.  Increasing fluid intake and taking a stool softener (such as Colace) will usually help or prevent this problem from occurring.  A mild laxative (Milk of Magnesia or Miralax) should be taken according to package directions if there are no bowel movements after 48 hours. °7. Unless discharge instructions indicate otherwise, you may remove your bandages 24-48 hours after surgery, and you may shower at that time.  You will have steri-strips (small skin tapes) in place directly over the incision.  These strips should be left on the skin for 7-10 days. °8. ACTIVITIES:  You may resume regular (light) daily activities  beginning the next day--such as daily self-care, walking, climbing stairs--gradually increasing activities as tolerated.  You may have sexual intercourse when it is comfortable.  Refrain from any heavy lifting or straining until approved by your doctor. °a. You may drive when you are no longer taking prescription pain medication, you can comfortably wear a seatbelt, and you can safely maneuver your car and apply brakes. °b. RETURN TO WORK:  2-3 weeks with light duty - no lifting over 15 lbs. °9. You should see your doctor in the office for a follow-up appointment approximately 2-3 weeks after your surgery.  Make sure that you call for this appointment within a day or two after you arrive home to insure a convenient appointment time. °10. OTHER INSTRUCTIONS:  __________________________________________________________________________________________________________________________________________________________________________________________  °WHEN TO CALL YOUR DOCTOR: °1. Fever over 101.0 °2. Inability to urinate °3. Nausea and/or vomiting °4. Extreme swelling or bruising °5. Continued bleeding from incision. °6. Increased pain, redness, or drainage from the incision ° °The clinic staff is available to answer your questions during regular business hours.  Please don’t hesitate to call and ask to speak to one of the nurses for clinical concerns.  If you have a medical emergency, go to the nearest emergency room or call 911.  A surgeon from [facility] is always on call at the hospital ° ° °[address], [address], [place], [place]  [address] ? ° [address] [address], [place], [place]   [address] °[phone]    [phone]    FAX [phone] °Web site: [email or web address] ° ° °

## 2012-03-26 NOTE — Transfer of Care (Signed)
Immediate Anesthesia Transfer of Care Note  Patient: Dennis Thomas  Procedure(s) Performed: Procedure(s) (LRB): HERNIA REPAIR VENTRAL ADULT (N/A)  Patient Location: PACU  Anesthesia Type: General  Level of Consciousness: awake, sedated and patient cooperative  Airway & Oxygen Therapy: Patient Spontanous Breathing and Patient connected to face mask oxygen  Post-op Assessment: Report given to PACU RN and Post -op Vital signs reviewed and stable  Post vital signs: Reviewed and stable  Complications: No apparent anesthesia complications

## 2012-03-26 NOTE — Progress Notes (Signed)
Pt is resting comfortably. Pt is drinking fluids and eating crackers without difficulty.

## 2012-03-26 NOTE — Anesthesia Postprocedure Evaluation (Signed)
  Anesthesia Post-op Note  Patient: Dennis Thomas  Procedure(s) Performed: Procedure(s) (LRB): HERNIA REPAIR VENTRAL ADULT (N/A)  Patient Location: PACU  Anesthesia Type: General  Level of Consciousness: oriented and sedated  Airway and Oxygen Therapy: Patient Spontanous Breathing  Post-op Pain: mild  Post-op Assessment: Post-op Vital signs reviewed, Patient's Cardiovascular Status Stable, Respiratory Function Stable and Patent Airway  Post-op Vital Signs: stable  Complications: No apparent anesthesia complications

## 2012-03-26 NOTE — Anesthesia Preprocedure Evaluation (Signed)
Anesthesia Evaluation  Patient identified by MRN, date of birth, ID band Patient awake    Reviewed: Allergy & Precautions, H&P , NPO status , Patient's Chart, lab work & pertinent test results, reviewed documented beta blocker date and time   Airway Mallampati: II TM Distance: >3 FB Neck ROM: Full    Dental  (+) Dental Advisory Given and Teeth Intact   Pulmonary neg pulmonary ROS,  breath sounds clear to auscultation        Cardiovascular negative cardio ROS  Rhythm:Regular Rate:Normal  Denies cardiac symptoms   Neuro/Psych negative neurological ROS  negative psych ROS   GI/Hepatic negative GI ROS, Neg liver ROS,   Endo/Other  Diabetes mellitus-, Type 2, Oral Hypoglycemic AgentsFair control  Renal/GU negative Renal ROS  negative genitourinary   Musculoskeletal   Abdominal   Peds negative pediatric ROS (+)  Hematology negative hematology ROS (+)   Anesthesia Other Findings Upper Front cap  Reproductive/Obstetrics negative OB ROS                           Anesthesia Physical Anesthesia Plan  ASA: III  Anesthesia Plan: General   Post-op Pain Management:    Induction: Intravenous  Airway Management Planned: Oral ETT  Additional Equipment:   Intra-op Plan:   Post-operative Plan: Extubation in OR  Informed Consent: I have reviewed the patients History and Physical, chart, labs and discussed the procedure including the risks, benefits and alternatives for the proposed anesthesia with the patient or authorized representative who has indicated his/her understanding and acceptance.   Dental advisory given  Plan Discussed with: CRNA and Surgeon  Anesthesia Plan Comments:         Anesthesia Quick Evaluation

## 2012-03-28 ENCOUNTER — Encounter (HOSPITAL_COMMUNITY): Payer: Self-pay | Admitting: Surgery

## 2012-04-04 ENCOUNTER — Encounter (INDEPENDENT_AMBULATORY_CARE_PROVIDER_SITE_OTHER): Payer: BC Managed Care – PPO

## 2012-04-15 ENCOUNTER — Encounter (INDEPENDENT_AMBULATORY_CARE_PROVIDER_SITE_OTHER): Payer: Self-pay | Admitting: Surgery

## 2012-04-15 ENCOUNTER — Ambulatory Visit (INDEPENDENT_AMBULATORY_CARE_PROVIDER_SITE_OTHER): Payer: BC Managed Care – PPO | Admitting: Surgery

## 2012-04-15 VITALS — BP 118/75 | HR 82 | Temp 97.6°F | Resp 16 | Ht 67.0 in | Wt 210.6 lb

## 2012-04-15 DIAGNOSIS — K439 Ventral hernia without obstruction or gangrene: Secondary | ICD-10-CM

## 2012-04-15 NOTE — Progress Notes (Signed)
S/p open ventral hernia repair on 2/20. He had a 7 mm defect above his umbilicus that was repaired with interrupted suture.  He is doing well.  No sign of infection.  No recurrence.  He may resume full activity.  Follow-up PRN.  Wilmon Arms. Corliss Skains, MD, [facility]  04/15/2012 3:47 PM

## 2012-05-28 ENCOUNTER — Ambulatory Visit (INDEPENDENT_AMBULATORY_CARE_PROVIDER_SITE_OTHER): Payer: BC Managed Care – PPO | Admitting: Emergency Medicine

## 2012-05-28 DIAGNOSIS — E119 Type 2 diabetes mellitus without complications: Secondary | ICD-10-CM

## 2012-05-28 DIAGNOSIS — IMO0002 Reserved for concepts with insufficient information to code with codable children: Secondary | ICD-10-CM

## 2012-05-28 LAB — POCT GLYCOSYLATED HEMOGLOBIN (HGB A1C): Hemoglobin A1C: 6.7

## 2012-05-28 MED ORDER — DOXYCYCLINE HYCLATE 100 MG PO TABS: 100.0000 mg | ORAL_TABLET | Freq: Two times a day (BID) | ORAL | Status: AC

## 2012-05-28 MED ORDER — METFORMIN HCL 500 MG PO TABS: 500.0000 mg | ORAL_TABLET | Freq: Two times a day (BID) | ORAL | Status: DC

## 2012-05-28 NOTE — Progress Notes (Signed)
  Subjective:    Patient ID: Dennis Thomas, male    DOB: 06/23/1976, 36 y.o.   MRN: [id number]  HPI patient enters with a painful swollen nail on the middle finger of his left hand to [place] needle last night was able to obtain some purulent material. He is in today for followup on his diabetes    Review of Systems denies chest pain shortness of breath or other problems.     Objective:   Physical Exam HEENT exam is unremarkable. Neck supple chest clear. Heart regular bradycardia no murmurs. Examination of the left middle finger reveals redness at the base of the nail. The area was cleaned with alcohol a 20-gauge needle was used to unroofed the cuticle. Purulent material was obtained.  Results for orders placed in visit on 05/28/12  GLUCOSE, POCT (MANUAL RESULT ENTRY)      Component Value Range   POC Glucose 157 (*) 70 - 99 (mg/dl)  POCT GLYCOSYLATED HEMOGLOBIN (HGB A1C)      Component Value Range   Hemoglobin A1C 6.7         Assessment & Plan:      Non-fasting sugar today showed at 157, will increase glucophage to twice a day.  Will treat paronychia with peroxide

## 2012-10-27 ENCOUNTER — Ambulatory Visit (INDEPENDENT_AMBULATORY_CARE_PROVIDER_SITE_OTHER): Payer: BC Managed Care – PPO | Admitting: Emergency Medicine

## 2012-10-27 VITALS — BP 115/76 | HR 65 | Temp 98.2°F | Resp 16 | Ht 69.0 in | Wt 219.0 lb

## 2012-10-27 DIAGNOSIS — B354 Tinea corporis: Secondary | ICD-10-CM

## 2012-10-27 DIAGNOSIS — L989 Disorder of the skin and subcutaneous tissue, unspecified: Secondary | ICD-10-CM

## 2012-10-27 DIAGNOSIS — R238 Other skin changes: Secondary | ICD-10-CM

## 2012-10-27 LAB — POCT SKIN KOH: Skin KOH, POC: POSITIVE

## 2012-10-27 MED ORDER — CLOTRIMAZOLE-BETAMETHASONE 1-0.05 % EX CREA: TOPICAL_CREAM | Freq: Two times a day (BID) | CUTANEOUS | Status: DC

## 2012-10-27 NOTE — Progress Notes (Signed)
  Subjective:    Patient ID: Dennis Thomas, male    DOB: 04-16-1976, 36 y.o.   MRN: [id number]  HPI patient enters with 2 complaints. The first at that he has multiple skin tags on the left side of his neck. He would like these removed. He also has chronic dryness and cracking to the bottoms of both feet and would like some medication to help with this.    Review of Systems     Objective:   Physical Exam physical exam reveals multiple skin tags on the left side of neck. These were prepped with Betadine and nontender with 0.1 cc of 2% plain. Each of these were excised with forceps and blunt scissors. The bases were cauterized with silver nitrate. Patient tolerated the procedure well.  Results for orders placed in visit on 10/27/12  POCT SKIN KOH      Component Value Range   Skin KOH, POC Positive          Assessment & Plan:     patient to return to clinic in the future for removal of more skin tag.

## 2012-10-27 NOTE — Progress Notes (Deleted)
  Subjective:    Patient ID: Dennis Thomas, male    DOB: December 01, 1976, 36 y.o.   MRN: [id number]  HPI Pt presents with dry, cracked, and itchy feet.  Pt also presents with multiple skin tags on neck that he wants to get removed.     Review of Systems     Objective:   Physical Exam        Assessment & Plan:

## 2013-05-30 ENCOUNTER — Other Ambulatory Visit: Payer: Self-pay | Admitting: Emergency Medicine

## 2013-05-30 NOTE — Telephone Encounter (Signed)
Pt is checking on his refill request he is at pharmacy now and they have not received anything yet he said Call back number is [phone] Pharmacy is [facility]. Veterinary surgeon

## 2013-07-06 ENCOUNTER — Ambulatory Visit (INDEPENDENT_AMBULATORY_CARE_PROVIDER_SITE_OTHER): Payer: BC Managed Care – PPO | Admitting: Family Medicine

## 2013-07-06 VITALS — BP 110/80 | HR 86 | Temp 98.5°F | Resp 16 | Ht 69.5 in | Wt 218.0 lb

## 2013-07-06 DIAGNOSIS — B354 Tinea corporis: Secondary | ICD-10-CM

## 2013-07-06 DIAGNOSIS — E119 Type 2 diabetes mellitus without complications: Secondary | ICD-10-CM

## 2013-07-06 DIAGNOSIS — Z0000 Encounter for general adult medical examination without abnormal findings: Secondary | ICD-10-CM

## 2013-07-06 DIAGNOSIS — L259 Unspecified contact dermatitis, unspecified cause: Secondary | ICD-10-CM

## 2013-07-06 LAB — POCT URINALYSIS DIPSTICK
Bilirubin, UA: NEGATIVE
Blood, UA: NEGATIVE
Glucose, UA: 100
Ketones, UA: 15
Leukocytes, UA: NEGATIVE
Nitrite, UA: NEGATIVE
Protein, UA: NEGATIVE
Spec Grav, UA: 1.025
Urobilinogen, UA: 0.2
pH, UA: 5.5

## 2013-07-06 LAB — POCT CBC
Granulocyte percent: 56.8 %G (ref 37–80)
HCT, POC: 50.9 % (ref 43.5–53.7)
Hemoglobin: 16.5 g/dL (ref 14.1–18.1)
Lymph, poc: 3.3 (ref 0.6–3.4)
MCH, POC: 29.8 pg (ref 27–31.2)
MCHC: 32.4 g/dL (ref 31.8–35.4)
MCV: 91.9 fL (ref 80–97)
MID (cbc): 0.7 (ref 0–0.9)
MPV: 9.3 fL (ref 0–99.8)
POC Granulocyte: 5.2 (ref 2–6.9)
POC LYMPH PERCENT: 35.8 % (ref 10–50)
POC MID %: 7.4 % (ref 0–12)
Platelet Count, POC: 328 10*3/uL (ref 142–424)
RBC: 5.54 M/uL (ref 4.69–6.13)
RDW, POC: 14 %
WBC: 9.1 10*3/uL (ref 4.6–10.2)

## 2013-07-06 LAB — POCT GLYCOSYLATED HEMOGLOBIN (HGB A1C): Hemoglobin A1C: 7.2

## 2013-07-06 MED ORDER — CLOTRIMAZOLE-BETAMETHASONE 1-0.05 % EX CREA: TOPICAL_CREAM | Freq: Two times a day (BID) | CUTANEOUS | Status: DC

## 2013-07-06 MED ORDER — METFORMIN HCL 500 MG PO TABS: 500.0000 mg | ORAL_TABLET | Freq: Two times a day (BID) | ORAL | Status: DC

## 2013-07-06 MED ORDER — MUPIROCIN CALCIUM 2 % EX CREA: TOPICAL_CREAM | Freq: Three times a day (TID) | CUTANEOUS | Status: DC

## 2013-07-06 NOTE — Progress Notes (Signed)
[facility]:  Office Visit  Chief Complaint:  Chief Complaint  Patient presents with  . Annual Exam    HPI: Dennis Thomas is a 37 y.o. male who complains of 1. Rash on bilateral foot, itchy. Does hav yeast infections on bottom of feet 2. 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 Annual visit-doing well.  4. DM-doing well, no sxs. No neuropathy. Last eye visit was in 2012.   Past Medical History  Diagnosis Date  . Lower back pain   . Pain in the abdomen   . Diabetes mellitus   . Ventral hernia   . Steatosis of liver    Past Surgical History  Procedure Laterality Date  . Ventral hernia repair  03/26/2012    Procedure: HERNIA REPAIR VENTRAL ADULT;  Surgeon: Wilmon Arms. Corliss Skains, MD;  Location: [facility];  Service: General;  Laterality: N/A;  . Hernia repair  02/20/2012    Ventral Hernia   History   Social History  . Marital Status: Married    Spouse Name: N/A    Number of Children: N/A  . Years of Education: N/A   Social History Main Topics  . Smoking status: Never Smoker   . Smokeless tobacco: None  . Alcohol Use: No  . Drug Use: No  . Sexually Active: Yes     Comment: married   Other Topics Concern  . None   Social History Narrative  . None   No family history on file. No Known Allergies Prior to Admission medications   Medication Sig Start Date End Date Taking? Authorizing Provider  metFORMIN (GLUCOPHAGE) 500 MG tablet Take 500 mg by mouth daily with breakfast.  02/20/12  Yes Chelle S Jeffery, PA-C  metFORMIN (GLUCOPHAGE) 500 MG tablet Take 1 tablet (500 mg total) by mouth 2 (two) times daily with a meal. PATIENT NEEDS OFFICE VISIT FOR ADDITIONAL REFILLS 05/30/13  Yes Collene Gobble, MD  clotrimazole-betamethasone (LOTRISONE) cream Apply topically 2 (two) times daily. 10/27/12   Collene Gobble, MD     ROS: The patient denies fevers, chills, night sweats, unintentional weight loss, chest pain, palpitations, wheezing, dyspnea on exertion,  nausea, vomiting, abdominal pain, dysuria, hematuria, melena, numbness, weakness, or tingling.  All other systems have been reviewed and were otherwise negative with the exception of those mentioned in the HPI and as above.    PHYSICAL EXAM: Filed Vitals:   07/06/13 1056  BP: 110/80  Pulse: 86  Temp: 98.5 F (36.9 C)  Resp: 16   Filed Vitals:   07/06/13 1056  Height: 5' 9.5" (1.765 m)  Weight: 218 lb (98.884 kg)   Body mass index is 31.74 kg/(m^2).  General: Alert, no acute distress HEENT:  Normocephalic, atraumatic, oropharynx patent. EOMI, PERRLA Cardiovascular:  Regular rate and rhythm, no rubs murmurs or gallops.  No Carotid bruits, radial pulse intact. No pedal edema.  Respiratory: Clear to auscultation bilaterally.  No wheezes, rales, or rhonchi.  No cyanosis, no use of accessory musculature GI: No organomegaly, abdomen is soft and non-tender, positive bowel sounds.  No masses. + hernia repair intact Skin: + contact derm rashes on bilateral top of foot in shoe/sandal pattern Neurologic: Facial musculature symmetric. Microfilament exam nl Psychiatric: Patient is appropriate throughout our interaction. Lymphatic: No cervical lymphadenopathy Musculoskeletal: Gait intact. GU-normal except he has an open nick from shaving right inguinal area, no unguinal herneias, circ no massess, scrotum nl.  Ventral henria intact + varicose veins on right Satrina Magallanes + rash  LABS: Results for orders placed in visit on 07/06/13  POCT URINALYSIS DIPSTICK      Result Value Range   Color, UA yeloow     Clarity, UA clear     Glucose, UA 100     Bilirubin, UA neg     Ketones, UA 15     Spec Grav, UA 1.025     Blood, UA neg     pH, UA 5.5     Protein, UA neg     Urobilinogen, UA 0.2     Nitrite, UA neg     Leukocytes, UA Negative    POCT GLYCOSYLATED HEMOGLOBIN (HGB A1C)      Result Value Range   Hemoglobin A1C 7.2    POCT CBC      Result Value Range   WBC 9.1  4.6 - 10.2 K/uL   Lymph,  poc 3.3  0.6 - 3.4   POC LYMPH PERCENT 35.8  10 - 50 %L   MID (cbc) 0.7  0 - 0.9   POC MID % 7.4  0 - 12 %M   POC Granulocyte 5.2  2 - 6.9   Granulocyte percent 56.8  37 - 80 %G   RBC 5.54  4.69 - 6.13 M/uL   Hemoglobin 16.5  14.1 - 18.1 g/dL   HCT, POC 16.1  09.6 - 53.7 %   MCV 91.9  80 - 97 fL   MCH, POC 29.8  27 - 31.2 pg   MCHC 32.4  31.8 - 35.4 g/dL   RDW, POC 04.5     Platelet Count, POC 328  142 - 424 K/uL   MPV 9.3  0 - 99.8 fL     EKG/XRAY:   Primary read interpreted by Dr. Conley Rolls at [facility].   ASSESSMENT/PLAN: Encounter Diagnoses  Name Primary?  . Diabetes   . Annual physical exam Yes  . Contact dermatitis   . Tinea corporis    Labs pending F/u in 3 months for DM Rx Bactroban for shaving nick, Lostrisone for contact der and also tinea pedis on soles.  He has enough of his DM meds but will refill Refer to opthalmology   Rockne Coons, DO 07/06/2013 12:05 PM

## 2013-07-07 LAB — COMPREHENSIVE METABOLIC PANEL
AST: 26 U/L (ref 0–37)
Albumin: 4.6 g/dL (ref 3.5–5.2)
BUN: 19 mg/dL (ref 6–23)
Calcium: 9.9 mg/dL (ref 8.4–10.5)
Chloride: 99 mEq/L (ref 96–112)
Potassium: 4.7 mEq/L (ref 3.5–5.3)
Sodium: 139 mEq/L (ref 135–145)
Total Protein: 7.6 g/dL (ref 6.0–8.3)

## 2013-07-07 LAB — LIPID PANEL
Cholesterol: 230 mg/dL — ABNORMAL HIGH (ref 0–200)
HDL: 44 mg/dL (ref >39)
LDL Cholesterol: 154 mg/dL — ABNORMAL HIGH (ref 0–99)
Total CHOL/HDL Ratio: 5.2 ratio
Triglycerides: 160 mg/dL — ABNORMAL HIGH (ref <150)
VLDL: 32 mg/dL (ref 0–40)

## 2013-07-07 LAB — COMPREHENSIVE METABOLIC PANEL WITH GFR
ALT: 57 U/L — ABNORMAL HIGH (ref 0–53)
Alkaline Phosphatase: 61 U/L (ref 39–117)
CO2: 29 meq/L (ref 19–32)
Creat: 0.71 mg/dL (ref 0.50–1.35)
Glucose, Bld: 222 mg/dL — ABNORMAL HIGH (ref 70–99)
Total Bilirubin: 0.4 mg/dL (ref 0.3–1.2)

## 2013-07-07 LAB — TSH: TSH: 1.1 u[IU]/mL (ref 0.350–4.500)

## 2013-07-19 ENCOUNTER — Encounter: Payer: Self-pay | Admitting: Family Medicine

## 2013-07-19 ENCOUNTER — Telehealth: Payer: Self-pay | Admitting: Family Medicine

## 2013-07-19 NOTE — Telephone Encounter (Signed)
Unable to leave message since mailbox full.

## 2013-07-22 IMAGING — CT CT ABD-PELV W/ CM
2 of 5 series · 17 of 46 positions shown, 19 images · IV contrast (APPLIED)
Comparison: None.

CLINICAL DATA: Appendicitis.  Incarcerated hernia.

CT ABDOMEN AND PELVIS WITH CONTRAST
TECHNIQUE: Multidetector CT imaging of the abdomen and pelvis was
performed following the standard protocol during bolus
administration of intravenous contrast.
Contrast: 100mL OMNIPAQUE IOHEXOL 300 MG/ML IV SOLN

[Series 2: abd_pel 5.0 b40s · axial · 0.93mm/px · z∈[-362,+108]mm · 14 of 104 slices shown, 16 images]
[im 5/104  soft-tissue]
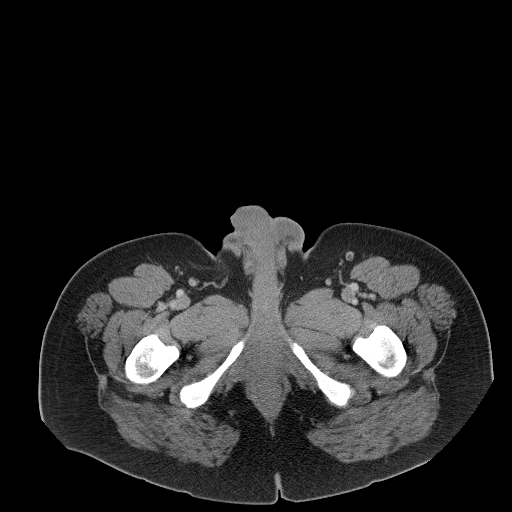
[im 5/104  bone]
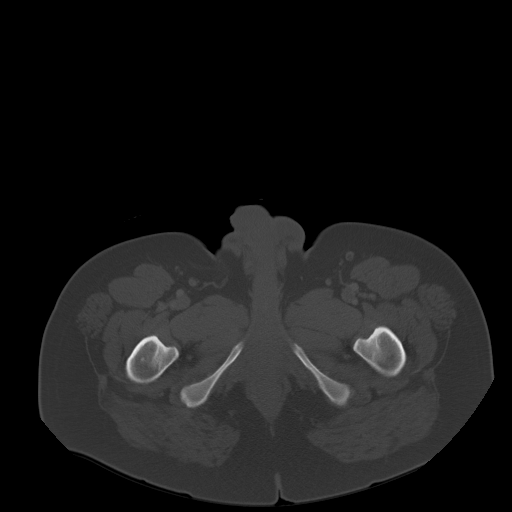
[im 14/104  soft-tissue]
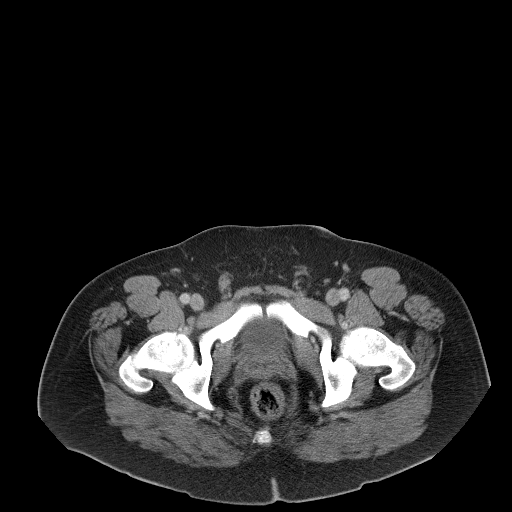
[im 18/104  soft-tissue]
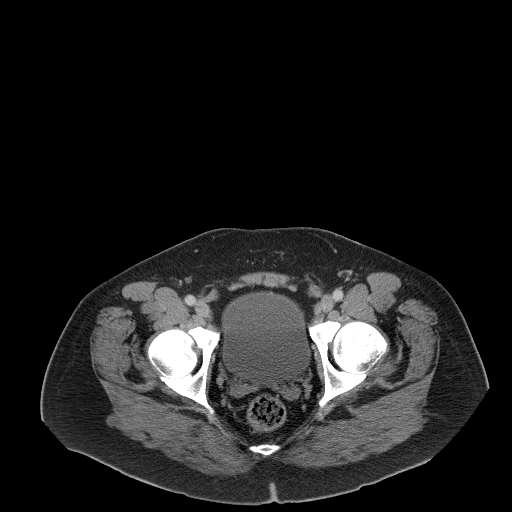
[im 27/104  soft-tissue]
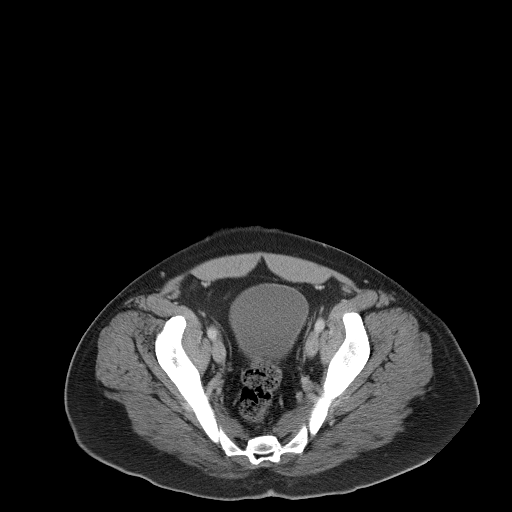
[im 36/104  soft-tissue]
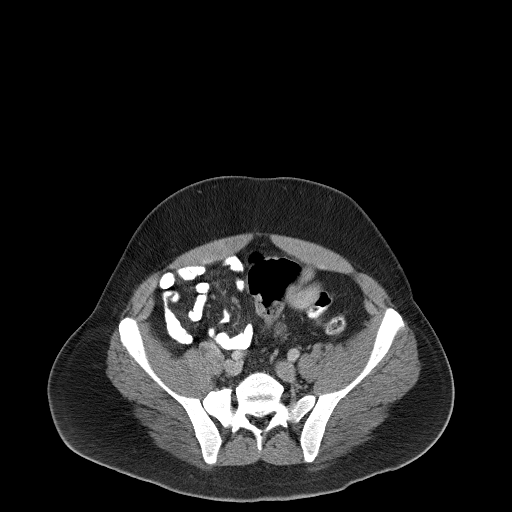
[im 41/104  soft-tissue]
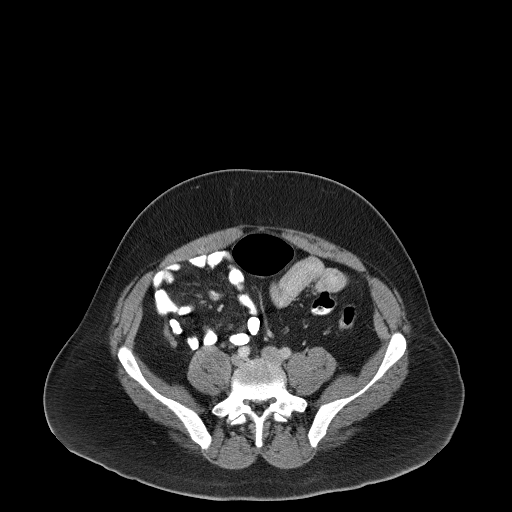
[im 50/104  soft-tissue]
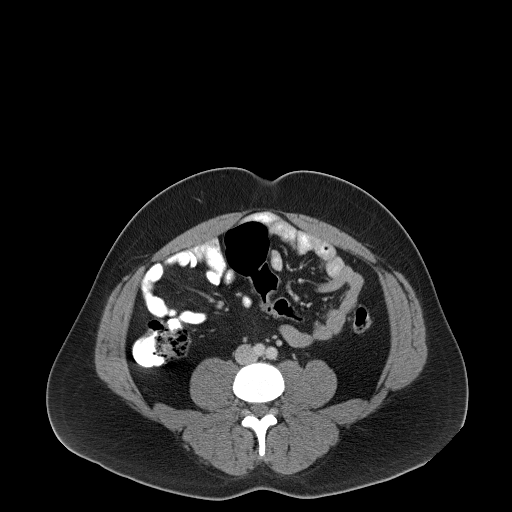
[im 54/104  soft-tissue]
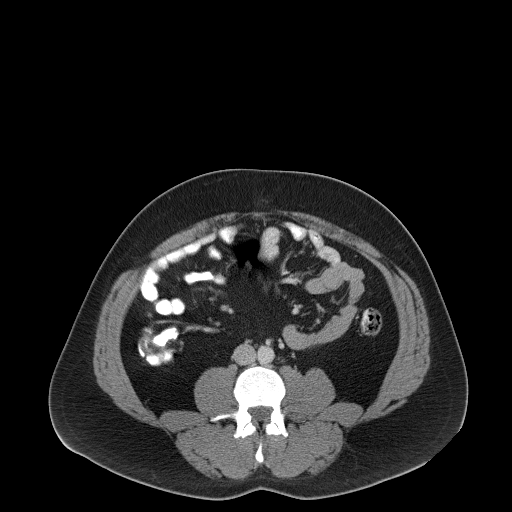
[im 63/104  soft-tissue]
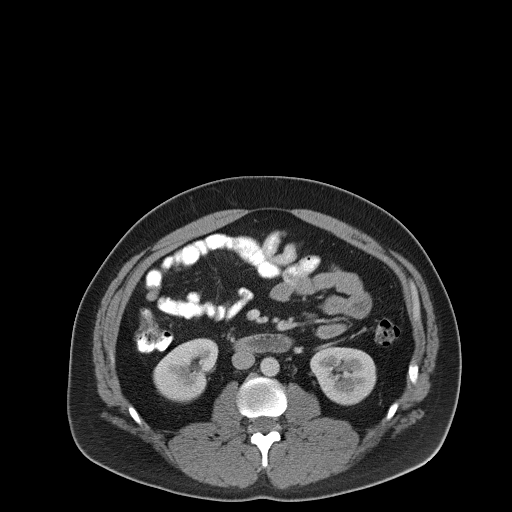
[im 63/104  bone]
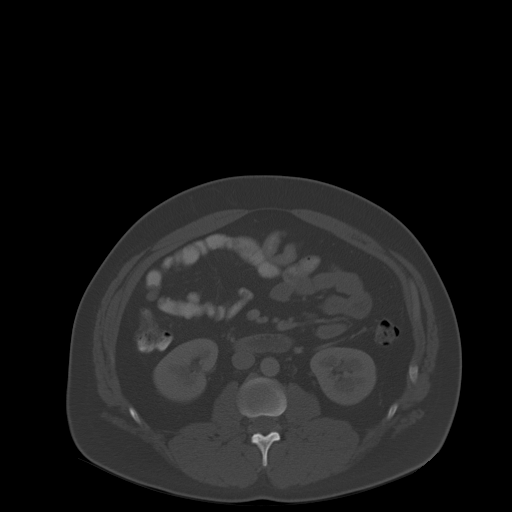
[im 68/104  soft-tissue]
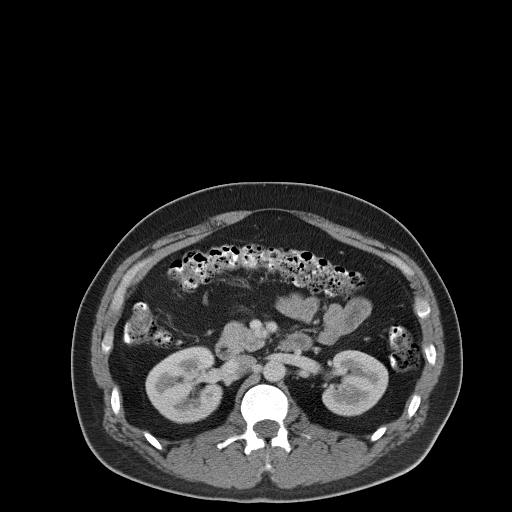
[im 77/104  soft-tissue]
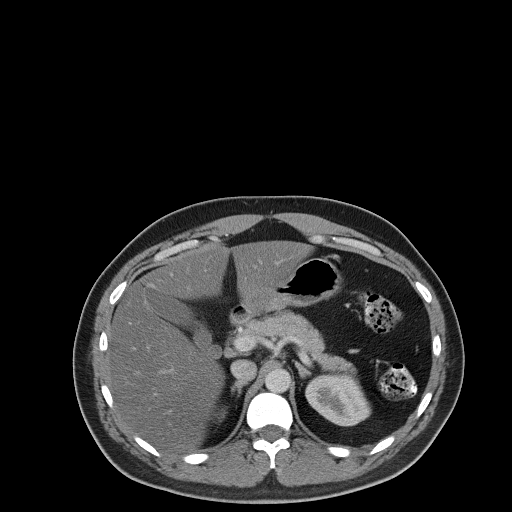
[im 86/104  soft-tissue]
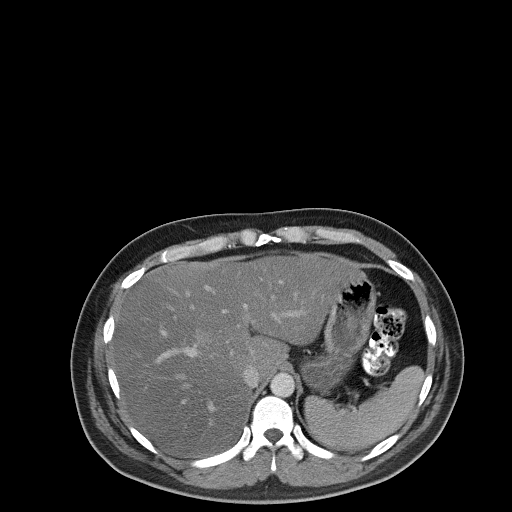
[im 90/104  soft-tissue]
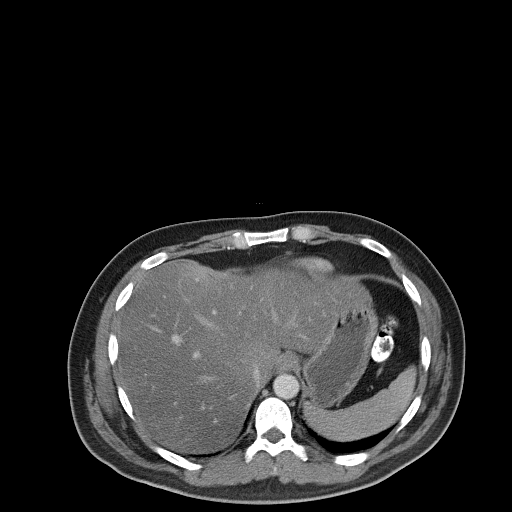
[im 99/104  soft-tissue]
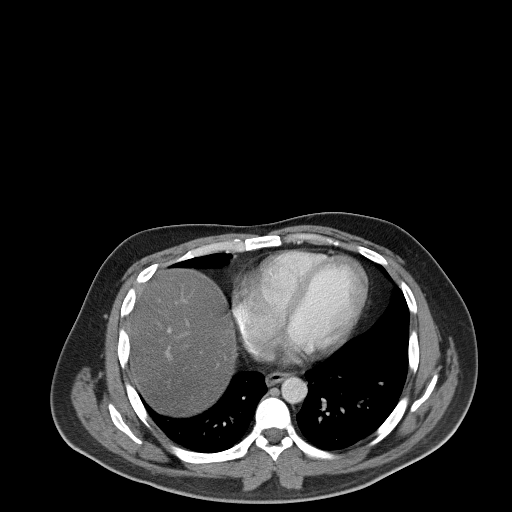

[Series 603: cor · coronal · 1.01mm/px · 3 of 135 slices shown]
[im 45/135  soft-tissue]
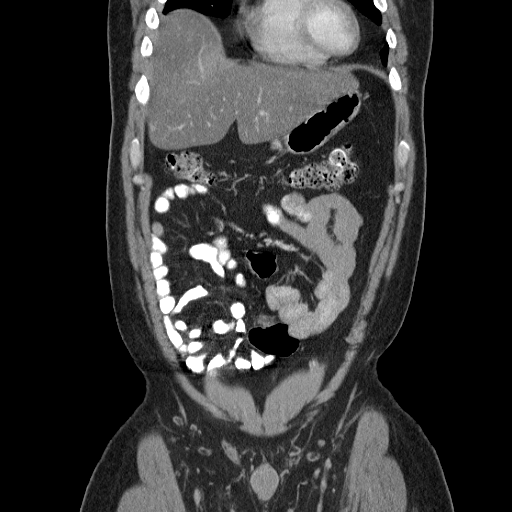
[im 60/135  soft-tissue]
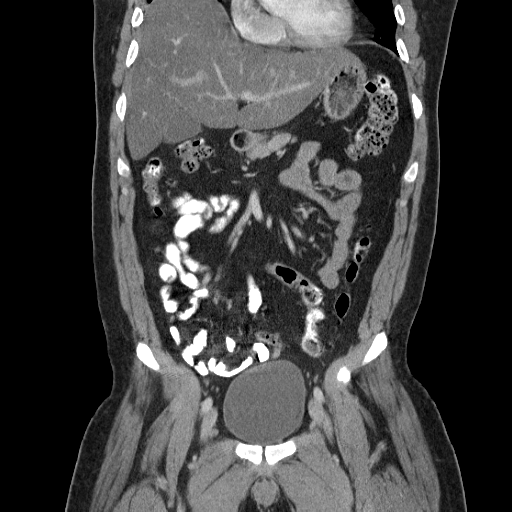
[im 75/135  soft-tissue]
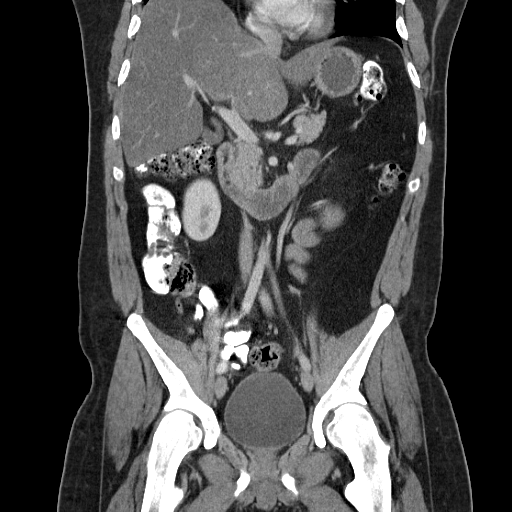

[17 of 46 positions shown; findings below may reference images not displayed]

FINDINGS: Dependent atelectasis at the lung bases.  Fatty liver.
No focal hepatic lesions are identified.  The gallbladder appears
normal.  No calcified gallstones.  The pancreas is normal.  Adrenal
glands are normal.  Normal renal enhancement.  Normal spleen.

Normal appendix in the right lower quadrant.  There is no inguinal
hernia.  There is a tiny fat containing ventral hernia with omental
vessels extending through the tiny hernia defect.  There is no
bowel contained within this tiny supraumbilical ventral hernia sac.

Small bowel appears normal.  Large bowel also is normal.  No
aggressive osseous lesions are present.  Benign bone island present
in the proximal right femur.
IMPRESSION: 1.  Tiny fat containing ventral hernia, likely containing a small
amount of omentum in the supraumbilical region.  No herniation of
bowel.
2.  Normal appendix.
3.  Fatty liver.

## 2015-10-04 ENCOUNTER — Encounter: Payer: Self-pay | Admitting: Emergency Medicine

## 2015-10-20 ENCOUNTER — Encounter (HOSPITAL_COMMUNITY): Payer: Self-pay

## 2015-10-20 ENCOUNTER — Emergency Department (HOSPITAL_COMMUNITY)
Admission: EM | Admit: 2015-10-20 | Discharge: 2015-10-20 | Disposition: A | Discharge status: Home / Self Care | Payer: Worker's Compensation | Attending: Emergency Medicine | Admitting: Emergency Medicine

## 2015-10-20 ENCOUNTER — Emergency Department (HOSPITAL_COMMUNITY): Payer: Worker's Compensation

## 2015-10-20 DIAGNOSIS — E119 Type 2 diabetes mellitus without complications: Secondary | ICD-10-CM | POA: Insufficient documentation

## 2015-10-20 DIAGNOSIS — S99912A Unspecified injury of left ankle, initial encounter: Secondary | ICD-10-CM | POA: Insufficient documentation

## 2015-10-20 DIAGNOSIS — Y9389 Activity, other specified: Secondary | ICD-10-CM | POA: Insufficient documentation

## 2015-10-20 DIAGNOSIS — Z8739 Personal history of other diseases of the musculoskeletal system and connective tissue: Secondary | ICD-10-CM | POA: Insufficient documentation

## 2015-10-20 DIAGNOSIS — W19XXXA Unspecified fall, initial encounter: Secondary | ICD-10-CM

## 2015-10-20 DIAGNOSIS — Y9289 Other specified places as the place of occurrence of the external cause: Secondary | ICD-10-CM | POA: Insufficient documentation

## 2015-10-20 DIAGNOSIS — M25562 Pain in left knee: Secondary | ICD-10-CM

## 2015-10-20 DIAGNOSIS — Z792 Long term (current) use of antibiotics: Secondary | ICD-10-CM | POA: Insufficient documentation

## 2015-10-20 DIAGNOSIS — W010XXA Fall on same level from slipping, tripping and stumbling without subsequent striking against object, initial encounter: Secondary | ICD-10-CM | POA: Diagnosis not present

## 2015-10-20 DIAGNOSIS — Z7984 Long term (current) use of oral hypoglycemic drugs: Secondary | ICD-10-CM | POA: Diagnosis not present

## 2015-10-20 DIAGNOSIS — Z8719 Personal history of other diseases of the digestive system: Secondary | ICD-10-CM | POA: Insufficient documentation

## 2015-10-20 DIAGNOSIS — S50311A Abrasion of right elbow, initial encounter: Secondary | ICD-10-CM | POA: Insufficient documentation

## 2015-10-20 DIAGNOSIS — S8992XA Unspecified injury of left lower leg, initial encounter: Secondary | ICD-10-CM | POA: Insufficient documentation

## 2015-10-20 DIAGNOSIS — T148XXA Other injury of unspecified body region, initial encounter: Secondary | ICD-10-CM

## 2015-10-20 DIAGNOSIS — Y998 Other external cause status: Secondary | ICD-10-CM | POA: Insufficient documentation

## 2015-10-20 MED ORDER — IBUPROFEN 800 MG PO TABS
800.0000 mg | ORAL_TABLET | Freq: Once | ORAL | Status: AC
  Administered 2015-10-20: 800 mg via ORAL
  Filled 2015-10-20: qty 1

## 2015-10-20 NOTE — ED Notes (Signed)
Pt returned from xray

## 2015-10-20 NOTE — ED Notes (Signed)
Pt c/o R elbow pain, L knee pain, and L ankle pain after falling backward in a chair this afternoon.  Pain score 10/10.  Pt has not taken anything for the pain.  Denies LOC.  Pt ambulated into Triage room w/o difficulty.

## 2015-10-20 NOTE — ED Notes (Signed)
PA at bedside.

## 2015-10-20 NOTE — Discharge Instructions (Signed)
Continue taking ibuprofen as prescribed over-the-counter for pain relief. You may also use ice and elevate your knee for pain relief. Use the knee immobilizer as needed for pain relief with walking. Follow-up with orthopedics within the next week. Return to the emergency department if symptoms worsen or new onset of fever, redness, numbness, tingling, weakness.

## 2015-10-20 NOTE — ED Provider Notes (Signed)
CSN: [id number]     Arrival date & time 10/20/15  1608 History  By signing my name below, I, Soijett Blue, attest that this documentation has been prepared under the direction and in the presence of Melburn HakeNicole Nadeau, PA-C Electronically Signed: Soijett Blue, ED Scribe. 10/20/2015. 6:09 PM.   Chief Complaint  Patient presents with  . Fall  . Elbow Pain  . Ankle Pain  . Knee Pain      The history is provided by the patient. No language interpreter was used.    Dennis MireOmar Thomas is a 39 y.o. male who presents to the Emergency Department complaining of fall onset this afternoon. He notes that he fell backward while in a chair at work while trying to sit in the chair. He reports that his left knee hit the chair when he fell and that his right elbow hit a box that was in his office. Pt is having associated symptoms of right elbow pain, left knee pain/swelling, left ankle pain, and  abrasion to right elbow. He notes that he has tried ice with no relief of his symptoms. He denies hitting his head, LOC, HA, gait problem, numbness, tingling, weakness.    Past Medical History  Diagnosis Date  . Lower back pain   . Pain in the abdomen   . Diabetes mellitus   . Ventral hernia   . Steatosis of liver    Past Surgical History  Procedure Laterality Date  . Ventral hernia repair  03/26/2012    Procedure: HERNIA REPAIR VENTRAL ADULT;  Surgeon: Wilmon ArmsMatthew K. Corliss Skainssuei, MD;  Location: [facility];  Service: General;  Laterality: N/A;  . Hernia repair  02/20/2012    Ventral Hernia   History reviewed. No pertinent family history. Social History  Substance Use Topics  . Smoking status: Never Smoker   . Smokeless tobacco: None  . Alcohol Use: No    Review of Systems  Musculoskeletal: Positive for joint swelling and arthralgias. Negative for gait problem.  Skin: Positive for wound (abrasion to right elbow). Negative for color change and rash.  Neurological: Negative for syncope, numbness and headaches.       No  tingling      Allergies  Review of patient's allergies indicates no known allergies.  Home Medications   Prior to Admission medications   Medication Sig Start Date End Date Taking? Authorizing Provider  clotrimazole-betamethasone (LOTRISONE) cream Apply topically 2 (two) times daily. 07/06/13   Thao P Le, DO  metFORMIN (GLUCOPHAGE) 500 MG tablet Take 1 tablet (500 mg total) by mouth 2 (two) times daily with a meal. 07/06/13   Thao P Le, DO  mupirocin cream (BACTROBAN) 2 % Apply topically 3 (three) times daily. 07/06/13   Thao P Le, DO   BP 127/77 mmHg  Pulse 86  Temp(Src) 98.5 F (36.9 C) (Oral)  Resp 16  SpO2 98% Physical Exam  Constitutional: He is oriented to person, place, and time. He appears well-developed and well-nourished. No distress.  HENT:  Head: Normocephalic and atraumatic.  Eyes: EOM are normal.  Neck: Normal range of motion. Neck supple.  Cardiovascular: Normal rate.   Pulmonary/Chest: Effort normal. No respiratory distress.  Abdominal: Soft. There is no tenderness.  Musculoskeletal: Normal range of motion. He exhibits tenderness.       Right elbow: He exhibits normal range of motion, no swelling, no effusion, no deformity and no laceration. Tenderness found. Olecranon process tenderness noted.       Left knee: He exhibits swelling. He  exhibits normal range of motion, no deformity, no laceration, no erythema, normal alignment, no LCL laxity, normal patellar mobility and no MCL laxity. Tenderness found. Medial joint line tenderness noted.       Left ankle: He exhibits normal range of motion and no swelling. Tenderness.  Right elbow: abrasions noted to posterior elbow, with mild TTP, no active bleeding, no lacerations. 5/5 strength. Left knee: mild swelling noted to medial knee. Full ROM. TTP at medial and posterior joint line. 5/5 strength. Sensation intact. Patient able to stand and ambulate without assistance, he endorses pain during ambulation. Remaining exam limited  due to pain. Left ankle: mild TTP medially, no swelling, no abrasion, no contusion. Full ROM. Sensation intact. 5/5 strength.   Neurological: He is alert and oriented to person, place, and time. He has normal strength and normal reflexes. No sensory deficit.  Skin: Skin is warm and dry.  Psychiatric: He has a normal mood and affect. His behavior is normal.  Nursing note and vitals reviewed.   ED Course  Procedures (including critical care time) DIAGNOSTIC STUDIES: Oxygen Saturation is 98% on RA, nl by my interpretation.    COORDINATION OF CARE: 6:00 PM Discussed treatment plan with pt at bedside which includes right elbow xray, left knee xray, left ankle xray, ice and pt agreed to plan.    Labs Review Labs Reviewed - No data to display  Imaging Review Dg Elbow Complete Right  10/20/2015  CLINICAL DATA:  Larey Seat today at work. Right elbow pain, left ankle pain and left knee pain. EXAM: RIGHT ELBOW - COMPLETE 3+ VIEW; LEFT KNEE - COMPLETE 4+ VIEW; LEFT ANKLE COMPLETE - 3+ VIEW COMPARISON:  None. FINDINGS: Right elbow: The joint spaces are maintained. No fracture, osteochondral abnormality or joint effusion. Left knee: Joint spaces are maintained. No fracture or osteochondral abnormality. Small joint effusion. Left ankle: The ankle mortise is maintained. No acute ankle fracture or osteochondral abnormality. No definite ankle joint effusion. The mid and hindfoot bony structures are intact. IMPRESSION: No acute bony findings. Electronically Signed   By: Rudie Meyer M.D.   On: 10/20/2015 18:05   Dg Ankle Complete Left  10/20/2015  CLINICAL DATA:  Larey Seat today at work. Right elbow pain, left ankle pain and left knee pain. EXAM: RIGHT ELBOW - COMPLETE 3+ VIEW; LEFT KNEE - COMPLETE 4+ VIEW; LEFT ANKLE COMPLETE - 3+ VIEW COMPARISON:  None. FINDINGS: Right elbow: The joint spaces are maintained. No fracture, osteochondral abnormality or joint effusion. Left knee: Joint spaces are maintained. No  fracture or osteochondral abnormality. Small joint effusion. Left ankle: The ankle mortise is maintained. No acute ankle fracture or osteochondral abnormality. No definite ankle joint effusion. The mid and hindfoot bony structures are intact. IMPRESSION: No acute bony findings. Electronically Signed   By: Rudie Meyer M.D.   On: 10/20/2015 18:05   Dg Knee Complete 4 Views Left  10/20/2015  CLINICAL DATA:  Larey Seat today at work. Right elbow pain, left ankle pain and left knee pain. EXAM: RIGHT ELBOW - COMPLETE 3+ VIEW; LEFT KNEE - COMPLETE 4+ VIEW; LEFT ANKLE COMPLETE - 3+ VIEW COMPARISON:  None. FINDINGS: Right elbow: The joint spaces are maintained. No fracture, osteochondral abnormality or joint effusion. Left knee: Joint spaces are maintained. No fracture or osteochondral abnormality. Small joint effusion. Left ankle: The ankle mortise is maintained. No acute ankle fracture or osteochondral abnormality. No definite ankle joint effusion. The mid and hindfoot bony structures are intact. IMPRESSION: No acute bony findings. Electronically Signed  By: Rudie Meyer M.D.   On: 10/20/2015 18:05   I have personally reviewed and evaluated these images and lab results as part of my medical decision-making.  Filed Vitals:   10/20/15 1842  BP: 107/70  Pulse: 87  Temp:   Resp: 17     MDM   Final diagnoses:  Fall, initial encounter  Abrasion  Left knee pain    Patient presents status post fall with left knee pain, left ankle pain, right elbow pain. Denies head injury or LOC. Endorses swelling to left knee and abrasion to right elbow. No relief with ice PTA.VSS. Exam revealed abrasion to right posterior elbow, no active bleeding. Mild swelling noted to left medial knee, full active range of motion of knee, TTP at medial and posterior joint line, patient able to stand and ambulate with a limp. Remaining knee exam limited due to pain. Left ankle with mild TTP at medial aspect, remaining exam benign. X-ray  of right elbow, left ankle negative. Left knee x-ray revealed no fracture, small joint effusion. Patient given ice and ibuprofen. Patient endorses mild pain relief. Discussed findings and plan for discharge with patient. Patient given knee immobilizer however refusing to use crutches. Patient given orthopedic follow-up for further management/evaluation. Advised patient to continue using ibuprofen, ice, and elevating his leg for pain relief.  Evaluation does not show pathology requring ongoing emergent intervention or admission. Pt is hemodynamically stable and mentating appropriately. Discussed findings/results and plan with patient/guardian, who agrees with plan. All questions answered. Return precautions discussed and outpatient follow up given.    I personally performed the services described in this documentation, which was scribed in my presence. The recorded information has been reviewed and is accurate.    Satira Sark [place], [place] 10/20/15 2028  Leta Baptist, MD 10/24/15 [phone]

## 2016-08-01 ENCOUNTER — Ambulatory Visit: Payer: Self-pay | Admitting: Podiatry

## 2016-08-23 ENCOUNTER — Ambulatory Visit: Payer: Self-pay | Admitting: Podiatry

## 2017-01-10 ENCOUNTER — Ambulatory Visit: Payer: Self-pay | Admitting: Podiatry

## 2017-01-16 ENCOUNTER — Ambulatory Visit: Payer: Self-pay | Admitting: Podiatry

## 2017-01-23 ENCOUNTER — Encounter: Payer: Self-pay | Admitting: Podiatry

## 2017-01-23 ENCOUNTER — Ambulatory Visit (INDEPENDENT_AMBULATORY_CARE_PROVIDER_SITE_OTHER): Payer: Self-pay | Admitting: Podiatry

## 2017-01-23 VITALS — BP 127/80 | HR 82 | Resp 14

## 2017-01-23 DIAGNOSIS — E119 Type 2 diabetes mellitus without complications: Secondary | ICD-10-CM

## 2017-01-23 DIAGNOSIS — L03032 Cellulitis of left toe: Secondary | ICD-10-CM

## 2017-01-23 DIAGNOSIS — M216X9 Other acquired deformities of unspecified foot: Secondary | ICD-10-CM

## 2017-01-23 NOTE — Progress Notes (Signed)
   Subjective:    Patient ID: Dennis Thomas, male    DOB: 07/06/1976, 41 y.o.   MRN: [id number]  HPI this patient presents to the office with chief complaint of a possible ingrown toenail big toe left foot. He says that his been painful and bleeding for the last 2 weeks. He says he has personally worked on the ingrowing toenail and the nail has improved at this moment patient is a diabetic and is concerned about possible infection due to his diabetes. He presents the office today for an evaluation and treatment of this condition    Review of Systems  All other systems reviewed and are negative.      Objective:   Physical Exam GENERAL APPEARANCE: Alert, conversant. Appropriately groomed. No acute distress.  VASCULAR: Pedal pulses are  palpable at  [facility] and PT bilateral.  Capillary refill time is immediate to all digits,  Normal temperature gradient.  Digital hair growth is present bilateral  NEUROLOGIC: sensation is normal to 5.07 monofilament at 5/5 sites bilateral.  Light touch is intact bilateral, Muscle strength normal.  MUSCULOSKELETAL: acceptable muscle strength, tone and stability bilateral.  Intrinsic muscluature intact bilateral.  Rectus appearance of foot and digits noted bilateral.  Cavus foot type noted.  DERMATOLOGIC: skin color, texture, and turgor are within normal limits.  No preulcerative lesions or ulcers  are seen, no interdigital maceration noted.  No open lesions present.  Digital nails are asymptomatic. No drainage noted. Patient has healing outside border left great toe.  No signs of redness or swelling or infection.         Assessment & Plan:  Healing paronychia left hallux  IE  Discussed this condition with patient. The nail is presently healed after he has worked on the nail. No evidence of any redness or swelling or infection noted. We discussed his nails and will perform definitive surgery if the problem recurs patient was told to soak his toe and Epson salts  Present. Return to the office when necessary   Helane GuntherGregory Mayer DPM

## 2018-07-05 ENCOUNTER — Other Ambulatory Visit: Payer: Self-pay

## 2018-07-05 ENCOUNTER — Ambulatory Visit (INDEPENDENT_AMBULATORY_CARE_PROVIDER_SITE_OTHER): Payer: Self-pay | Admitting: Physician Assistant

## 2018-07-05 ENCOUNTER — Encounter: Payer: Self-pay | Admitting: Physician Assistant

## 2018-07-05 VITALS — BP 117/81 | HR 88 | Temp 98.6°F | Resp 18 | Ht 69.5 in | Wt 216.0 lb

## 2018-07-05 DIAGNOSIS — E1165 Type 2 diabetes mellitus with hyperglycemia: Secondary | ICD-10-CM

## 2018-07-05 DIAGNOSIS — M5416 Radiculopathy, lumbar region: Secondary | ICD-10-CM

## 2018-07-05 LAB — POCT GLYCOSYLATED HEMOGLOBIN (HGB A1C): HEMOGLOBIN A1C: 13.5 % — AB (ref 4.0–5.6)

## 2018-07-05 MED ORDER — GLIPIZIDE 5 MG PO TABS: 5.0000 mg | ORAL_TABLET | Freq: Every day | ORAL | 0 refills | Status: DC

## 2018-07-05 MED ORDER — MELOXICAM 15 MG PO TABS: 7.5000 mg | ORAL_TABLET | Freq: Every day | ORAL | 0 refills | Status: AC

## 2018-07-05 MED ORDER — METFORMIN HCL 1000 MG PO TABS: ORAL_TABLET | ORAL | 0 refills | Status: DC

## 2018-07-05 NOTE — Patient Instructions (Addendum)
Come back in about 3 weeks so we can check your back and talk about your diabetes.  We have a lot of work to do.     IF you received an x-ray today, you will receive an invoice from [facility]. Please contact [facility] at [phone] with questions or concerns regarding your invoice.   IF you received labwork today, you will receive an invoice from [facility]. Please contact [facility] at [phone] with questions or concerns regarding your invoice.   Our billing staff will not be able to assist you with questions regarding bills from these companies.  You will be contacted with the lab results as soon as they are available. The fastest way to get your results is to activate your My Chart account. Instructions are located on the last page of this paperwork. If you have not heard from us regarding the results in 2 weeks, please contact this office.

## 2018-07-05 NOTE — Progress Notes (Signed)
07/05/2018 3:37 PM   DOB: 04/26/1976 / MRN: [id number]  SUBJECTIVE:  Dennis MireOmar Swader is a 42 y.o. male presenting for burning right leg pain that occurs when he last night. Symptoms present for about 3 weeks and is not better or worse.  He has tried 400 mg of ibuprofen with good relief of pain.  He has a history of back pain.  No images exist in the system.  Has a history of umbilical hernia and wonders if he has an inguinal hernia.  Has been off of metformin now for some time.  Has a history of modestly controlled diabetes.  Tries to be careful about what he eats.  Urinates at most one time a night if at all.  Denies excessive thirst.  He is a Production designer, theatre/television/filmmanager at a hotel.  He is often on his feet with his job.  He has No Known Allergies.   He  has a past medical history of Diabetes mellitus, Lower back pain, Pain in the abdomen, Steatosis of liver, and Ventral hernia.    He  reports that he has never smoked. He has never used smokeless tobacco. He reports that he does not drink alcohol or use drugs. He  reports that he currently engages in sexual activity. The patient  has a past surgical history that includes Ventral hernia repair (03/26/2012) and Hernia repair (02/20/2012).  His family history is not on file.  Review of Systems  Constitutional: Negative for chills, diaphoresis and fever.  Eyes: Negative.   Respiratory: Negative for cough, hemoptysis, sputum production, shortness of breath and wheezing.   Cardiovascular: Negative for chest pain, orthopnea and leg swelling.  Gastrointestinal: Negative for nausea.  Skin: Negative for rash.  Neurological: Negative for dizziness, sensory change, speech change, focal weakness and headaches.    The problem list and medications were reviewed and updated by myself where necessary and exist elsewhere in the encounter.   OBJECTIVE:  BP 117/81   Pulse 88   Temp 98.6 F (37 C) (Oral)   Resp 18   Ht 5' 9.5" (1.765 m)   Wt 216 lb (98 kg)   SpO2 100%    BMI 31.44 kg/m   Wt Readings from Last 3 Encounters:  07/05/18 216 lb (98 kg)  07/06/13 218 lb (98.9 kg)  10/27/12 219 lb (99.3 kg)   Temp Readings from Last 3 Encounters:  07/05/18 98.6 F (37 C) (Oral)  10/20/15 98.5 F (36.9 C) (Oral)  07/06/13 98.5 F (36.9 C) (Oral)   BP Readings from Last 3 Encounters:  07/05/18 117/81  01/23/17 127/80  10/20/15 107/70   Pulse Readings from Last 3 Encounters:  07/05/18 88  01/23/17 82  10/20/15 87    Physical Exam  Constitutional: He is oriented to person, place, and time. He appears well-developed. He is active.  Non-toxic appearance. He does not appear ill.  Eyes: Pupils are equal, round, and reactive to light. Conjunctivae and EOM are normal.  Cardiovascular: Normal rate, regular rhythm, S1 normal, S2 normal, normal heart sounds, intact distal pulses and normal pulses. Exam reveals no gallop and no friction rub.  No murmur heard. Pulmonary/Chest: Effort normal. No stridor. No respiratory distress. He has no wheezes. He has no rales.  Abdominal: Soft. Normal appearance and bowel sounds are normal. He exhibits no distension and no mass. There is no tenderness. There is no rigidity, no rebound, no guarding and no CVA tenderness. No hernia.  Musculoskeletal: Normal range of motion. He exhibits no edema.  Neurological: He is alert and oriented to person, place, and time. He has normal strength and normal reflexes. He is not disoriented. No cranial nerve deficit or sensory deficit. He exhibits normal muscle tone. Coordination and gait normal.  Skin: Skin is warm and dry. He is not diaphoretic. No pallor.  Psychiatric: He has a normal mood and affect. His behavior is normal.  Nursing note and vitals reviewed.   Lab Results  Component Value Date   HGBA1C 13.5 (A) 07/05/2018    Lab Results  Component Value Date   WBC 9.1 07/06/2013   HGB 16.5 07/06/2013   HCT 50.9 07/06/2013   MCV 91.9 07/06/2013   PLT 297 03/24/2012    Lab  Results  Component Value Date   CREATININE 0.71 07/06/2013   BUN 19 07/06/2013   NA 139 07/06/2013   K 4.7 07/06/2013   CL 99 07/06/2013   CO2 29 07/06/2013    Lab Results  Component Value Date   ALT 57 (H) 07/06/2013   AST 26 07/06/2013   ALKPHOS 61 07/06/2013   BILITOT 0.4 07/06/2013    Lab Results  Component Value Date   TSH 1.100 07/06/2013    Lab Results  Component Value Date   CHOL 230 (H) 07/06/2013   HDL 44 07/06/2013   LDLCALC 154 (H) 07/06/2013   TRIG 160 (H) 07/06/2013   CHOLHDL 5.2 07/06/2013     ASSESSMENT AND PLAN:  Dennis Thomas was seen today for leg pain.  Diagnoses and all orders for this visit:  Uncontrolled type 2 diabetes mellitus with hyperglycemia (HCC): This is unfortunate.  He was taking metformin 7 years back with good control.  I will restart metformin today and titrate this dose up over the next 2 weeks.  Also starting him on medium dose of glipizide in the morning for added glycemic control.  Advised that he be very strict with his diet and check fasting sugars in the morning and bring that log to me so we can evaluate further in about 3 weeks.  We will need to check for microalbuminuria at that time as well. -     Cancel: Hemoglobin A1c -     POCT glycosylated hemoglobin (Hb A1C)  Lumbar radiculopathy -     meloxicam (MOBIC) 15 MG tablet; Take 0.5-1 tablets (7.5-15 mg total) by mouth daily. Take with food. Do not take Ibuprofen, Goody's, or Aleve while taking this medication.    The patient is advised to call or return to clinic if he does not see an improvement in symptoms, or to seek the care of the closest emergency department if he worsens with the above plan.   Deliah Boston, MHS, PA-C Primary Care at [facility] 07/05/2018 3:37 PM

## 2018-07-06 LAB — HEPATIC FUNCTION PANEL
ALT: 27 IU/L (ref 0–44)
AST: 17 IU/L (ref 0–40)
Albumin: 4.5 g/dL (ref 3.5–5.5)
Alkaline Phosphatase: 69 IU/L (ref 39–117)
BILIRUBIN TOTAL: 0.4 mg/dL (ref 0.0–1.2)
Bilirubin, Direct: 0.11 mg/dL (ref 0.00–0.40)
Total Protein: 7.1 g/dL (ref 6.0–8.5)

## 2018-07-30 ENCOUNTER — Other Ambulatory Visit: Payer: Self-pay

## 2018-07-30 ENCOUNTER — Encounter: Payer: Self-pay | Admitting: Physician Assistant

## 2018-07-30 ENCOUNTER — Ambulatory Visit: Payer: Self-pay | Admitting: Physician Assistant

## 2018-07-30 VITALS — BP 131/81 | HR 81 | Temp 98.4°F | Resp 16 | Ht 69.5 in | Wt 214.6 lb

## 2018-07-30 DIAGNOSIS — Z23 Encounter for immunization: Secondary | ICD-10-CM

## 2018-07-30 DIAGNOSIS — E1165 Type 2 diabetes mellitus with hyperglycemia: Secondary | ICD-10-CM

## 2018-07-30 DIAGNOSIS — E7800 Pure hypercholesterolemia, unspecified: Secondary | ICD-10-CM

## 2018-07-30 DIAGNOSIS — Z9189 Other specified personal risk factors, not elsewhere classified: Secondary | ICD-10-CM

## 2018-07-30 LAB — POCT URINALYSIS DIP (MANUAL ENTRY)
BILIRUBIN UA: NEGATIVE mg/dL
Bilirubin, UA: NEGATIVE
Glucose, UA: 100 mg/dL — AB
Leukocytes, UA: NEGATIVE
Nitrite, UA: NEGATIVE
PROTEIN UA: NEGATIVE mg/dL
RBC UA: NEGATIVE
SPEC GRAV UA: 1.02 (ref 1.010–1.025)
Urobilinogen, UA: 0.2 E.U./dL
pH, UA: 5.5 (ref 5.0–8.0)

## 2018-07-30 LAB — GLUCOSE, POCT (MANUAL RESULT ENTRY): POC Glucose: 171 mg/dl — AB (ref 70–99)

## 2018-07-30 MED ORDER — CLOTRIMAZOLE-BETAMETHASONE 1-0.05 % EX CREA: 1.0000 "application " | TOPICAL_CREAM | Freq: Two times a day (BID) | CUTANEOUS | 0 refills | Status: DC

## 2018-07-30 MED ORDER — ASPIRIN EC 81 MG PO TBEC: 81.0000 mg | DELAYED_RELEASE_TABLET | Freq: Every day | ORAL | 3 refills | Status: DC

## 2018-07-30 MED ORDER — METFORMIN HCL 1000 MG PO TABS
ORAL_TABLET | ORAL | 1 refills | Status: DC
Start: 2018-07-30 — End: 2019-09-28

## 2018-07-30 MED ORDER — CLOTRIMAZOLE-BETAMETHASONE 1-0.05 % EX CREA: 1.0000 "application " | TOPICAL_CREAM | Freq: Two times a day (BID) | CUTANEOUS | 3 refills | Status: DC

## 2018-07-30 MED ORDER — ATORVASTATIN CALCIUM 40 MG PO TABS: 40.0000 mg | ORAL_TABLET | Freq: Every day | ORAL | 3 refills | Status: DC

## 2018-07-30 MED ORDER — GLIPIZIDE 5 MG PO TABS: 5.0000 mg | ORAL_TABLET | Freq: Every day | ORAL | 0 refills | Status: DC

## 2018-07-30 NOTE — Addendum Note (Signed)
Addended by: Ofilia NeasLARK, Shi Grose L on: 07/30/2018 09:20 AM   Modules accepted: Level of Service

## 2018-07-30 NOTE — Progress Notes (Signed)
07/30/2018 8:51 AM   DOB: 08/21/1976 / MRN: [id number]  SUBJECTIVE:  Dennis Thomas is a 42 y.o. male presenting for recheck of diabetes last diagnosed on 7/6 of this year. He was started on metformin and glipizide and tells me he is taking his medications as precribed. He denies sock and glove paresthesia, chest pain, SOB, leg swelling, orthopnea. Never smoker.     He has No Known Allergies.   He  has a past medical history of Diabetes mellitus, Lower back pain, Pain in the abdomen, Steatosis of liver, and Ventral hernia.    He  reports that he has never smoked. He has never used smokeless tobacco. He reports that he does not drink alcohol or use drugs. He  reports that he currently engages in sexual activity. The patient  has a past surgical history that includes Ventral hernia repair (03/26/2012) and Hernia repair (02/20/2012).  His family history is not on file.  Review of Systems  Constitutional: Negative for chills, diaphoresis and fever.  Eyes: Negative.   Respiratory: Negative for cough, hemoptysis, sputum production, shortness of breath and wheezing.   Cardiovascular: Negative for chest pain, orthopnea and leg swelling.  Gastrointestinal: Negative for abdominal pain, blood in stool, constipation, diarrhea, heartburn, melena, nausea and vomiting.  Genitourinary: Negative for dysuria, flank pain, frequency, hematuria and urgency.  Musculoskeletal: Positive for back pain and myalgias.  Skin: Negative for rash.  Neurological: Negative for dizziness, sensory change, speech change, focal weakness and headaches.    The problem list and medications were reviewed and updated by myself where necessary and exist elsewhere in the encounter.   OBJECTIVE:  BP 131/81   Pulse 81   Temp 98.4 F (36.9 C)   Resp 16   Ht 5' 9.5" (1.765 m)   Wt 214 lb 9.6 oz (97.3 kg)   SpO2 97%   BMI 31.24 kg/m   Wt Readings from Last 3 Encounters:  07/30/18 214 lb 9.6 oz (97.3 kg)  07/05/18 216 lb  (98 kg)  07/06/13 218 lb (98.9 kg)   Temp Readings from Last 3 Encounters:  07/30/18 98.4 F (36.9 C)  07/05/18 98.6 F (37 C) (Oral)  10/20/15 98.5 F (36.9 C) (Oral)   BP Readings from Last 3 Encounters:  07/30/18 131/81  07/05/18 117/81  01/23/17 127/80   Pulse Readings from Last 3 Encounters:  07/30/18 81  07/05/18 88  01/23/17 82    Physical Exam  Constitutional: He is oriented to person, place, and time. He appears well-developed. He is active.  Non-toxic appearance. He does not appear ill.  HENT:  Right Ear: Hearing, tympanic membrane, external ear and ear canal normal.  Left Ear: Hearing, tympanic membrane, external ear and ear canal normal.  Nose: Nose normal. Right sinus exhibits no maxillary sinus tenderness and no frontal sinus tenderness. Left sinus exhibits no maxillary sinus tenderness and no frontal sinus tenderness.  Mouth/Throat: Uvula is midline, oropharynx is clear and moist and mucous membranes are normal. No oropharyngeal exudate, posterior oropharyngeal edema or tonsillar abscesses.  Eyes: Pupils are equal, round, and reactive to light. Conjunctivae and EOM are normal.  Cardiovascular: Normal rate, regular rhythm, S1 normal, S2 normal, normal heart sounds, intact distal pulses and normal pulses. Exam reveals no gallop and no friction rub.  No murmur heard. Pulmonary/Chest: Effort normal. No stridor. No respiratory distress. He has no wheezes. He has no rales.  Abdominal: Soft. Normal appearance and bowel sounds are normal. He exhibits no distension and no  mass. There is no tenderness. There is no rigidity, no rebound, no guarding and no CVA tenderness. No hernia.  Musculoskeletal: Normal range of motion. He exhibits no edema.  Lymphadenopathy:       Head (right side): No submandibular and no tonsillar adenopathy present.       Head (left side): No submandibular and no tonsillar adenopathy present.    He has no cervical adenopathy.  Neurological: He is  alert and oriented to person, place, and time. He has normal strength and normal reflexes. He is not disoriented. No cranial nerve deficit or sensory deficit. He exhibits normal muscle tone. Coordination and gait normal.  Skin: Skin is warm and dry. He is not diaphoretic. No pallor.  Psychiatric: He has a normal mood and affect. His behavior is normal.  Nursing note and vitals reviewed.   Lab Results  Component Value Date   HGBA1C 13.5 (A) 07/05/2018   Results for orders placed or performed in visit on 07/30/18  POCT glucose (manual entry)  Result Value Ref Range   POC Glucose 171 (A) 70 - 99 mg/dl  POCT urinalysis dipstick  Result Value Ref Range   Color, UA yellow yellow   Clarity, UA clear clear   Glucose, UA =100 (A) negative mg/dL   Bilirubin, UA negative negative   Ketones, POC UA negative negative mg/dL   Spec Grav, UA 1.610 9.604 - 1.025   Blood, UA negative negative   pH, UA 5.5 5.0 - 8.0   Protein Ur, POC negative negative mg/dL   Urobilinogen, UA 0.2 0.2 or 1.0 E.U./dL   Nitrite, UA Negative Negative   Leukocytes, UA Negative Negative     ASSESSMENT AND PLAN:  [place] was seen today for diabetes.  Diagnoses and all orders for this visit:  Uncontrolled type 2 diabetes mellitus with hyperglycemia [facility]): Per today's fasting he should achieve glycemic control with the combo of metformin and glipizide.  Start in a medium dose statin today along with baby ASA. Fortunately he is a never smoker.  -     POCT glucose (manual entry) -     POCT urinalysis dipstick -     Microalbumin, urine -     HIV antibody -     Lipid Panel -     Ambulatory referral to Ophthalmology -     glipiZIDE (GLUCOTROL) 5 MG tablet; Take 1 tablet (5 mg total) by mouth daily before breakfast. -     metFORMIN (GLUCOPHAGE) 1000 MG tablet; Take one tab daily for a week with breakfast.  Then increase to one tab morning and night with food and continue.  Elevated LDL cholesterol level -     atorvastatin  (LIPITOR) 40 MG tablet; Take 1 tablet (40 mg total) by mouth daily at 6 PM. Start 1/2 tab for a week then increase to the full tab daily.  At risk for acute ischemic cardiac event -     aspirin EC 81 MG tablet; Take 1 tablet (81 mg total) by mouth daily.  Need for Tdap vaccination -     Tdap vaccine greater than or equal to 7yo IM  Need for prophylactic vaccination against Streptococcus pneumoniae (pneumococcus) -     Pneumococcal polysaccharide vaccine 23-valent greater than or equal to 2yo subcutaneous/IM    The patient is advised to call or return to clinic if he does not see an improvement in symptoms, or to seek the care of the closest emergency department if he worsens with the above plan.  Deliah Boston, MHS, PA-C Primary Care at [facility] 07/30/2018 8:51 AM

## 2018-07-30 NOTE — Patient Instructions (Addendum)
  Please go to your eye appointment in the next month or two.  This will need to be done every year from here on out so we can ensure the diabetes does not cause diabetic retinopathy.   Please start the cholesterol medication.  Based off your last lipid panel you at risk of a heart attack or stroke.  The cholesterol medication can drastically reduce that risk.   Please start a baby aspirin for the same reason you are taking the cholesterol medicaiton.   Please continue you diabetes medications at they are for now and come back in about 1 month to see PA Whitney McVey.    IF you received an x-ray today, you will receive an invoice from [facility]. Please contact [facility] at [phone] with questions or concerns regarding your invoice.   IF you received labwork today, you will receive an invoice from [facility]. Please contact [facility] at [phone] with questions or concerns regarding your invoice.   Our billing staff will not be able to assist you with questions regarding bills from these companies.  You will be contacted with the lab results as soon as they are available. The fastest way to get your results is to activate your My Chart account. Instructions are located on the last page of this paperwork. If you have not heard from us regarding the results in 2 weeks, please contact this office.

## 2018-07-31 LAB — LIPID PANEL
CHOL/HDL RATIO: 3.8 ratio (ref 0.0–5.0)
Cholesterol, Total: 191 mg/dL (ref 100–199)
HDL: 50 mg/dL (ref >39)
LDL Calculated: 118 mg/dL — ABNORMAL HIGH (ref 0–99)
Triglycerides: 117 mg/dL (ref 0–149)
VLDL CHOLESTEROL CAL: 23 mg/dL (ref 5–40)

## 2018-07-31 LAB — MICROALBUMIN, URINE: MICROALBUM., U, RANDOM: 3.9 ug/mL

## 2018-07-31 LAB — HIV ANTIBODY (ROUTINE TESTING W REFLEX): HIV Screen 4th Generation wRfx: NONREACTIVE

## 2018-08-07 LAB — HM DIABETES EYE EXAM

## 2019-09-28 ENCOUNTER — Ambulatory Visit (INDEPENDENT_AMBULATORY_CARE_PROVIDER_SITE_OTHER): Payer: Self-pay | Admitting: Family Medicine

## 2019-09-28 ENCOUNTER — Other Ambulatory Visit: Payer: Self-pay

## 2019-09-28 ENCOUNTER — Encounter: Payer: Self-pay | Admitting: Family Medicine

## 2019-09-28 VITALS — BP 118/79 | HR 78 | Temp 99.3°F | Wt 214.0 lb

## 2019-09-28 DIAGNOSIS — E785 Hyperlipidemia, unspecified: Secondary | ICD-10-CM

## 2019-09-28 DIAGNOSIS — E1165 Type 2 diabetes mellitus with hyperglycemia: Secondary | ICD-10-CM

## 2019-09-28 DIAGNOSIS — M25562 Pain in left knee: Secondary | ICD-10-CM

## 2019-09-28 LAB — COMPREHENSIVE METABOLIC PANEL
ALT: 20 IU/L (ref 0–44)
AST: 15 IU/L (ref 0–40)
Albumin/Globulin Ratio: 1.9 (ref 1.2–2.2)
Albumin: 4.7 g/dL (ref 4.0–5.0)
Alkaline Phosphatase: 84 IU/L (ref 39–117)
BUN/Creatinine Ratio: 28 — ABNORMAL HIGH (ref 9–20)
BUN: 19 mg/dL (ref 6–24)
Bilirubin Total: 0.4 mg/dL (ref 0.0–1.2)
CO2: 22 mmol/L (ref 20–29)
Calcium: 10.4 mg/dL — ABNORMAL HIGH (ref 8.7–10.2)
Chloride: 94 mmol/L — ABNORMAL LOW (ref 96–106)
Creatinine, Ser: 0.67 mg/dL — ABNORMAL LOW (ref 0.76–1.27)
GFR calc Af Amer: 137 mL/min/{1.73_m2} (ref >59)
GFR calc non Af Amer: 119 mL/min/{1.73_m2} (ref >59)
Globulin, Total: 2.5 g/dL (ref 1.5–4.5)
Glucose: 340 mg/dL — ABNORMAL HIGH (ref 65–99)
Potassium: 4.8 mmol/L (ref 3.5–5.2)
Sodium: 134 mmol/L (ref 134–144)
Total Protein: 7.2 g/dL (ref 6.0–8.5)

## 2019-09-28 LAB — LIPID PANEL
Chol/HDL Ratio: 4.3 ratio (ref 0.0–5.0)
Cholesterol, Total: 245 mg/dL — ABNORMAL HIGH (ref 100–199)
HDL: 57 mg/dL (ref >39)
LDL Chol Calc (NIH): 165 mg/dL — ABNORMAL HIGH (ref 0–99)
Triglycerides: 127 mg/dL (ref 0–149)
VLDL Cholesterol Cal: 23 mg/dL (ref 5–40)

## 2019-09-28 LAB — HEMOGLOBIN A1C
Est. average glucose Bld gHb Est-mCnc: 283 mg/dL
Hgb A1c MFr Bld: 11.5 % — ABNORMAL HIGH (ref 4.8–5.6)

## 2019-09-28 MED ORDER — METFORMIN HCL 1000 MG PO TABS: ORAL_TABLET | ORAL | 1 refills | Status: DC

## 2019-09-28 NOTE — Patient Instructions (Addendum)
   Area of swelling/tenderness of the knee is a possible painful or irritated varicose vein.  Try applying warm compress to affected area a few times per day, occasional Advil if needed but would start with Tylenol first if it is uncomfortable.  Recheck in 1 week.   Restart metformin once per day for now and can discuss other meds next week with lab results.    If you have lab work done today you will be contacted with your lab results within the next 2 weeks.  If you have not heard from [place] then please contact us. The fastest way to get your results is to register for My Chart.   IF you received an x-ray today, you will receive an invoice from [facility]. Please contact [facility] at [phone] with questions or concerns regarding your invoice.   IF you received labwork today, you will receive an invoice from [facility]. Please contact [facility] at [phone] with questions or concerns regarding your invoice.   Our billing staff will not be able to assist you with questions regarding bills from these companies.  You will be contacted with the lab results as soon as they are available. The fastest way to get your results is to activate your My Chart account. Instructions are located on the last page of this paperwork. If you have not heard from [place] regarding the results in 2 weeks, please contact this office.

## 2019-09-28 NOTE — Progress Notes (Signed)
Subjective:    Patient ID: Dennis MireOmar Thomas, male    DOB: 01/12/1976, 43 y.o.   MRN: [id number]  HPI Dennis Thomas is a 43 y.o. male Presents today for: Chief Complaint  Patient presents with  . Knee Pain    left inner knee pain and swelling for 1 month. Pain makes it hard to walk at times. Had surgery on left knee 3 years ago.  Presents for left knee pain.  Prior patient of Deliah BostonMichael Clark, PA-C.  Last appointment in July 2019.    L knee pain:  Swelling and pain in lower medial knee past month. Had been in [place] when noticed - father passed away in August. NKI. Slight swelling/warmth, tender. No calf pain. Meniscus surgery about 3.5 yrs ago. No similar pain. no anterior pain. No mechanical sx's. Notices after walking for awhile. No hx of dvt, no calf pain.  Tx: none.    Diabetes: History of diabetes, Elevated A1c in July 2019.  Restarted metformin with plan on titration upwards, also glipizide for added glycemic control.  Plan for metformin 1000 mg twice daily when checked July 31.  Continued on glipizide 5 mg daily. Started on Lipitor 40 mg daily, initially 20 mg at July 30 2018 visit.  Plan for September 2019 follow-up but has not been seen since  July 2019.  Was able to tolerate metfomin 1000mg  BID without side effects, along with glipizide 5mg  qd. Some diet changes as well.  Off meds past month, but took above doses prior. No home readings.  No n/v. Some polyuria/polydispia, no blurry vision.   Microalbumin: Optho, foot exam, pneumovax: Referred to ophthalmology in July 2019. - last saw about 8 months ago.  Lab Results  Component Value Date   HGBA1C 13.5 (A) 07/05/2018   HGBA1C 7.2 07/06/2013   HGBA1C 6.7 05/28/2012   Lab Results  Component Value Date   LDLCALC 118 (H) 07/30/2018   CREATININE 0.71 07/06/2013       Patient Active Problem List   Diagnosis Date Noted  . Ventral hernia 02/12/2012  . Uncontrolled diabetes mellitus (HCC) 02/03/2012   Past Medical  History:  Diagnosis Date  . Diabetes mellitus   . Lower back pain   . Pain in the abdomen   . Steatosis of liver   . Ventral hernia    Past Surgical History:  Procedure Laterality Date  . HERNIA REPAIR  02/20/2012   Ventral Hernia  . VENTRAL HERNIA REPAIR  03/26/2012   Procedure: HERNIA REPAIR VENTRAL ADULT;  Surgeon: Wilmon ArmsMatthew K. Corliss Skainssuei, MD;  Location: [facility];  Service: General;  Laterality: N/A;   No Known Allergies Prior to Admission medications   Medication Sig Start Date End Date Taking? Authorizing Provider  metFORMIN (GLUCOPHAGE) 1000 MG tablet Take one tab daily for a week with breakfast.  Then increase to one tab morning and night with food and continue. 07/30/18  Yes Ofilia Neaslark, Michael L, PA-C   Social History   Socioeconomic History  . Marital status: Married    Spouse name: Not on file  . Number of children: Not on file  . Years of education: Not on file  . Highest education level: Not on file  Occupational History  . Not on file  Social Needs  . Financial resource strain: Not on file  . Food insecurity    Worry: Not on file    Inability: Not on file  . Transportation needs    Medical: Not on file    Non-medical: Not  on file  Tobacco Use  . Smoking status: Never Smoker  . Smokeless tobacco: Never Used  Substance and Sexual Activity  . Alcohol use: No  . Drug use: No  . Sexual activity: Yes    Comment: married  Lifestyle  . Physical activity    Days per week: Not on file    Minutes per session: Not on file  . Stress: Not on file  Relationships  . Social Herbalist on phone: Not on file    Gets together: Not on file    Attends religious service: Not on file    Active member of club or organization: Not on file    Attends meetings of clubs or organizations: Not on file    Relationship status: Not on file  . Intimate partner violence    Fear of current or ex partner: Not on file    Emotionally abused: Not on file    Physically abused: Not on file     Forced sexual activity: Not on file  Other Topics Concern  . Not on file  Social History Narrative  . Not on file    Review of Systems  Constitutional: Negative for fatigue and unexpected weight change.  Eyes: Negative for visual disturbance.  Respiratory: Negative for cough, chest tightness and shortness of breath.   Cardiovascular: Negative for chest pain, palpitations and leg swelling.  Gastrointestinal: Negative for abdominal pain and blood in stool.  Endocrine: Positive for polydipsia and polyuria.  Genitourinary: Negative for difficulty urinating.  Musculoskeletal: Positive for joint swelling. Negative for gait problem.  Neurological: Negative for dizziness, light-headedness and headaches.       Objective:   Physical Exam Vitals signs reviewed.  Constitutional:      Appearance: He is well-developed.  HENT:     Head: Normocephalic and atraumatic.  Eyes:     Pupils: Pupils are equal, round, and reactive to light.  Neck:     Vascular: No carotid bruit or JVD.  Cardiovascular:     Rate and Rhythm: Normal rate and regular rhythm.     Heart sounds: Normal heart sounds. No murmur.  Pulmonary:     Effort: Pulmonary effort is normal.     Breath sounds: Normal breath sounds. No rales.  Musculoskeletal:     Left knee: He exhibits normal range of motion, no effusion, no ecchymosis, no deformity, no LCL laxity, normal meniscus and no MCL laxity. No tenderness found. No medial joint line, no lateral joint line, no MCL, no LCL and no patellar tendon tenderness noted.       Legs:  Skin:    General: Skin is warm and dry.  Neurological:     Mental Status: He is alert and oriented to person, place, and time.    Vitals:   09/28/19 0938  BP: 118/79  Pulse: 78  Temp: 99.3 F (37.4 C)  TempSrc: Oral  SpO2: 98%  Weight: 214 lb (97.1 kg)       Assessment & Plan:   Dennis Thomas is a 43 y.o. male Uncontrolled type 2 diabetes mellitus with hyperglycemia ([place]) - Plan:  Comprehensive metabolic panel, Lipid panel, Microalbumin / creatinine urine ratio, Hemoglobin A1c, metFORMIN (GLUCOPHAGE) 1000 MG tablet, HM Diabetes Foot Exam  - restart metformin for now, QD up to BID as tolerated. May also need glipizide. Labs pending as baseline as off meds past month at least.   Left knee pain, unspecified chronicity  - appears to be possible phlebitis of  superficial veins, varicose veins. Less likely pedis anserine bursitis as higher than expected.  Symptomatic care with heat, recheck next week.  Hyperlipidemia, unspecified hyperlipidemia type - Plan: Comprehensive metabolic panel, Lipid panel  -Check labs.  Discussed regimen at follow-up  Meds ordered this encounter  Medications  . metFORMIN (GLUCOPHAGE) 1000 MG tablet    Sig: Take one tab daily for a week with breakfast.  Then increase to one tab morning and night with food and continue.    Dispense:  270 tablet    Refill:  1   Patient Instructions     Area of swelling/tenderness of the knee is a possible painful or irritated varicose vein.  Try applying warm compress to affected area a few times per day, occasional Advil if needed but would start with Tylenol first if it is uncomfortable.  Recheck in 1 week.   Restart metformin once per day for now and can discuss other meds next week with lab results.    If you have lab work done today you will be contacted with your lab results within the next 2 weeks.  If you have not heard from [place] then please contact us. The fastest way to get your results is to register for My Chart.   IF you received an x-ray today, you will receive an invoice from [facility]. Please contact [facility] at [phone] with questions or concerns regarding your invoice.   IF you received labwork today, you will receive an invoice from [place]. Please contact [facility] at [phone] with questions or concerns regarding your invoice.   Our billing staff will not be  able to assist you with questions regarding bills from these companies.  You will be contacted with the lab results as soon as they are available. The fastest way to get your results is to activate your My Chart account. Instructions are located on the last page of this paperwork. If you have not heard from [place] regarding the results in 2 weeks, please contact this office.       Signed,   Meredith Staggers, MD Primary Care at [facility].  09/28/19 1:25 PM

## 2019-09-29 LAB — MICROALBUMIN / CREATININE URINE RATIO
Creatinine, Urine: 71.9 mg/dL
Microalb/Creat Ratio: 10 mg/g creat (ref 0–29)
Microalbumin, Urine: 7.3 ug/mL

## 2019-10-12 ENCOUNTER — Other Ambulatory Visit: Payer: Self-pay

## 2019-10-12 ENCOUNTER — Ambulatory Visit (INDEPENDENT_AMBULATORY_CARE_PROVIDER_SITE_OTHER): Payer: Self-pay | Admitting: Family Medicine

## 2019-10-12 ENCOUNTER — Encounter: Payer: Self-pay | Admitting: Family Medicine

## 2019-10-12 VITALS — BP 116/75 | HR 87 | Temp 98.6°F | Wt 214.6 lb

## 2019-10-12 DIAGNOSIS — E1165 Type 2 diabetes mellitus with hyperglycemia: Secondary | ICD-10-CM

## 2019-10-12 DIAGNOSIS — M25562 Pain in left knee: Secondary | ICD-10-CM

## 2019-10-12 DIAGNOSIS — E785 Hyperlipidemia, unspecified: Secondary | ICD-10-CM

## 2019-10-12 LAB — GLUCOSE, POCT (MANUAL RESULT ENTRY): POC Glucose: 395 mg/dl — AB (ref 70–99)

## 2019-10-12 MED ORDER — GLIPIZIDE 5 MG PO TABS: 5.0000 mg | ORAL_TABLET | Freq: Two times a day (BID) | ORAL | 3 refills | Status: DC

## 2019-10-12 MED ORDER — ATORVASTATIN CALCIUM 10 MG PO TABS
10.0000 mg | ORAL_TABLET | Freq: Every day | ORAL | 0 refills | Status: DC
Start: 2019-10-12 — End: 2020-01-31

## 2019-10-12 NOTE — Progress Notes (Signed)
Subjective:    Patient ID: Dennis Thomas, male    DOB: 04/04/1976, 43 y.o.   MRN: [id number]  HPI Dennis Thomas is a 43 y.o. male Presents today for: Chief Complaint  Patient presents with   Follow-up    2 week f/u on left dorsal leg (behind knee)and go over labs    L knee pain: Possible superficial phlebitis of varicose veins, discussed that September 20 visit.  Symptomatic care with heat discussed. Feels a lot better after heat. Only noticed after standing 4-5 hrs. No calf pain/Cp/dyspnea. No thigh pain.   Diabetes: Complicated by hyperglycemia, medication nonadherence. Uncontrolled at September 20 visit, had been medication nonadherent, previously took Metformin 1000 mg twice daily and glipizide 5 mg daily.  Additionally had been on Lipitor 40 mg but off that medicine as well at last visit. Restarted metformin 1000 mg daily for initial week, then increase to twice daily.  Microalbumin: Normal ratio September 28 Optho, foot exam, pneumovax: Due for ophthalmology exam, otherwise up-to-date.    Up to BID metformin - no GI intolerance or other side effects.  Planning on restarting exercise - renewed membership.  Has meter, but no home readings.  Ate grits, boiled egg this am.   Lab Results  Component Value Date   HGBA1C 11.5 (H) 09/28/2019   HGBA1C 13.5 (A) 07/05/2018   HGBA1C 7.2 07/06/2013   Lab Results  Component Value Date   LDLCALC 165 (H) 09/28/2019   CREATININE 0.67 (L) 09/28/2019     Patient Active Problem List   Diagnosis Date Noted   Ventral hernia 02/12/2012   Uncontrolled diabetes mellitus (HCC) 02/03/2012   Past Medical History:  Diagnosis Date   Diabetes mellitus    Lower back pain    Pain in the abdomen    Steatosis of liver    Ventral hernia    Past Surgical History:  Procedure Laterality Date   HERNIA REPAIR  02/20/2012   Ventral Hernia   VENTRAL HERNIA REPAIR  03/26/2012   Procedure: HERNIA REPAIR VENTRAL ADULT;  Surgeon:  Wilmon ArmsMatthew K. Corliss Skainssuei, MD;  Location: [facility];  Service: General;  Laterality: N/A;   No Known Allergies Prior to Admission medications   Medication Sig Start Date End Date Taking? Authorizing Provider  metFORMIN (GLUCOPHAGE) 1000 MG tablet Take one tab daily for a week with breakfast.  Then increase to one tab morning and night with food and continue. 09/28/19  Yes Shade FloodGreene, Dessiree Sze R, MD   Social History   Socioeconomic History   Marital status: Married    Spouse name: Not on file   Number of children: Not on file   Years of education: Not on file   Highest education level: Not on file  Occupational History   Not on file  Social Needs   Financial resource strain: Not on file   Food insecurity    Worry: Not on file    Inability: Not on file   Transportation needs    Medical: Not on file    Non-medical: Not on file  Tobacco Use   Smoking status: Never Smoker   Smokeless tobacco: Never Used  Substance and Sexual Activity   Alcohol use: No   Drug use: No   Sexual activity: Yes    Comment: married  Lifestyle   Physical activity    Days per week: Not on file    Minutes per session: Not on file   Stress: Not on file  Relationships   Social connections  Talks on phone: Not on file    Gets together: Not on file    Attends religious service: Not on file    Active member of club or organization: Not on file    Attends meetings of clubs or organizations: Not on file    Relationship status: Not on file   Intimate partner violence    Fear of current or ex partner: Not on file    Emotionally abused: Not on file    Physically abused: Not on file    Forced sexual activity: Not on file  Other Topics Concern   Not on file  Social History Narrative   Not on file    Review of Systems  Constitutional: Negative for fatigue and unexpected weight change.  Eyes: Negative for visual disturbance.  Respiratory: Negative for cough, chest tightness and shortness of breath.    Cardiovascular: Negative for chest pain, palpitations and leg swelling.  Gastrointestinal: Negative for abdominal pain, blood in stool, nausea and vomiting.  Neurological: Negative for dizziness, light-headedness and headaches.   Per [facility].     Objective:   Physical Exam Vitals signs reviewed.  Constitutional:      Appearance: He is well-developed.  HENT:     Head: Normocephalic and atraumatic.  Eyes:     Pupils: Pupils are equal, round, and reactive to light.  Neck:     Vascular: No carotid bruit or JVD.  Cardiovascular:     Rate and Rhythm: Normal rate and regular rhythm.     Heart sounds: Normal heart sounds. No murmur.  Pulmonary:     Effort: Pulmonary effort is normal.     Breath sounds: Normal breath sounds. No rales.  Skin:    General: Skin is warm and dry.  Neurological:     Mental Status: He is alert and oriented to person, place, and time.    Vitals:   10/12/19 0928  BP: 116/75  Pulse: 87  Temp: 98.6 F (37 C)  TempSrc: Oral  SpO2: 97%  Weight: 214 lb 9.6 oz (97.3 kg)   Results for orders placed or performed in visit on 10/12/19  POCT glucose (manual entry)  Result Value Ref Range   POC Glucose 395 (A) 70 - 99 mg/dl       Assessment & Plan:   Dennis Thomas is a 43 y.o. male Uncontrolled type 2 diabetes mellitus with hyperglycemia (HCC) - Plan: POCT glucose (manual entry), glipiZIDE (GLUCOTROL) 5 MG tablet  -Still uncontrolled.  Potential need for insulin.  We will try glipizide 5 mg twice daily, continue max dose Metformin, monitor home readings and recheck next few weeks.  ER precautions given if any nausea, vomiting, abdominal pain, or other signs of symptomatic hyperglycemia.  Hypoglycemic precautions also discussed with glipizide.  Left knee pain, unspecified chronicity  -Possible superficial phlebitis, improved.  RTC precautions.  Hyperlipidemia, unspecified hyperlipidemia type - Plan: atorvastatin (LIPITOR) 10 MG tablet  -Start Lipitor 10 mg,  anticipate higher dose but will check tolerability at lower dose first.  Meds ordered this encounter  Medications   atorvastatin (LIPITOR) 10 MG tablet    Sig: Take 1 tablet (10 mg total) by mouth daily.    Dispense:  90 tablet    Refill:  0   glipiZIDE (GLUCOTROL) 5 MG tablet    Sig: Take 1 tablet (5 mg total) by mouth 2 (two) times daily before a meal.    Dispense:  60 tablet    Refill:  3   Patient Instructions  Check blood sugar daily - fasting or 2 hours after meals. Return in 3 weeks with those readings.  Continue metformin twice per day, add glipizide with meals twice per day.   Please schedule appointment with eye care provider for retinopathy screening.   Return to the clinic or go to the nearest emergency room if any of your symptoms worsen or new symptoms occur.     If you have lab work done today you will be contacted with your lab results within the next 2 weeks.  If you have not heard from [place] then please contact us. The fastest way to get your results is to register for My Chart.   IF you received an x-ray today, you will receive an invoice from [facility]. Please contact [facility] at [phone] with questions or concerns regarding your invoice.   IF you received labwork today, you will receive an invoice from [place]. Please contact [facility] at [phone] with questions or concerns regarding your invoice.   Our billing staff will not be able to assist you with questions regarding bills from these companies.  You will be contacted with the lab results as soon as they are available. The fastest way to get your results is to activate your My Chart account. Instructions are located on the last page of this paperwork. If you have not heard from [place] regarding the results in 2 weeks, please contact this office.       Signed,   Merri Ray, MD Primary Care at [place].  10/12/19 10:48 AM

## 2019-10-12 NOTE — Patient Instructions (Addendum)
Check blood sugar daily - fasting or 2 hours after meals. Return in 3 weeks with those readings.  Continue metformin twice per day, add glipizide with meals twice per day.   Please schedule appointment with eye care provider for retinopathy screening.   Return to the clinic or go to the nearest emergency room if any of your symptoms worsen or new symptoms occur.     If you have lab work done today you will be contacted with your lab results within the next 2 weeks.  If you have not heard from [place] then please contact us. The fastest way to get your results is to register for My Chart.   IF you received an x-ray today, you will receive an invoice from [facility]. Please contact [facility] at [phone] with questions or concerns regarding your invoice.   IF you received labwork today, you will receive an invoice from [facility]. Please contact [facility] at [phone] with questions or concerns regarding your invoice.   Our billing staff will not be able to assist you with questions regarding bills from these companies.  You will be contacted with the lab results as soon as they are available. The fastest way to get your results is to activate your My Chart account. Instructions are located on the last page of this paperwork. If you have not heard from [place] regarding the results in 2 weeks, please contact this office.

## 2019-11-02 ENCOUNTER — Other Ambulatory Visit: Payer: Self-pay

## 2019-11-02 ENCOUNTER — Encounter: Payer: Self-pay | Admitting: Family Medicine

## 2019-11-02 ENCOUNTER — Ambulatory Visit (INDEPENDENT_AMBULATORY_CARE_PROVIDER_SITE_OTHER): Payer: Self-pay | Admitting: Family Medicine

## 2019-11-02 VITALS — BP 128/83 | HR 76 | Temp 98.3°F | Wt 212.8 lb

## 2019-11-02 DIAGNOSIS — E1165 Type 2 diabetes mellitus with hyperglycemia: Secondary | ICD-10-CM

## 2019-11-02 LAB — GLUCOSE, POCT (MANUAL RESULT ENTRY): POC Glucose: 186 mg/dl — AB (ref 70–99)

## 2019-11-02 MED ORDER — METFORMIN HCL 500 MG PO TABS: 500.0000 mg | ORAL_TABLET | Freq: Two times a day (BID) | ORAL | 1 refills | Status: DC

## 2019-11-02 NOTE — Patient Instructions (Addendum)
Home readings look much better.  Change Metformin to 500 mg twice per day.  If you have diarrhea on that dosing, let me know.  Glipizide twice per day with meals, but watch for low blood sugars.  See information below.  Recheck in approximately 6 weeks for repeat test.  Thank you for coming in today.   Return to the clinic or go to the nearest emergency room if any of your symptoms worsen or new symptoms occur.   Type 2 Diabetes Mellitus, Self Care, Adult When you have type 2 diabetes (type 2 diabetes mellitus), you must make sure your blood sugar (glucose) stays in a healthy range. You can do this with:  Nutrition.  Exercise.  Lifestyle changes.  Medicines or insulin, if needed.  Support from your doctors and others. How to stay aware of blood sugar   Check your blood sugar level every day, as often as told.  Have your A1c (hemoglobin A1c) level checked two or more times a year. Have it checked more often if your doctor tells you to. Your doctor will set personal treatment goals for you. Generally, you should have these blood sugar levels:  Before meals (preprandial): 80-130 mg/dL (4.4-7.2 mmol/L).  After meals (postprandial): below 180 mg/dL (10 mmol/L).  A1c level: less than 7%. How to manage high and low blood sugar Signs of high blood sugar High blood sugar is called hyperglycemia. Know the signs of high blood sugar. Signs may include:  Feeling: ? Thirsty. ? Hungry. ? Very tired.  Needing to pee (urinate) more than usual.  Blurry vision. Signs of low blood sugar Low blood sugar is called hypoglycemia. This is when blood sugar is at or below 70 mg/dL (3.9 mmol/L). Signs may include:  Feeling: ? Hungry. ? Worried or nervous (anxious). ? Sweaty and clammy. ? Confused. ? Dizzy. ? Sleepy. ? Sick to your stomach (nauseous).  Having: ? A fast heartbeat. ? A headache. ? A change in your vision. ? Jerky movements that you cannot control (seizure). ? Tingling  or no feeling (numbness) around your mouth, lips, or tongue.  Having trouble with: ? Moving (coordination). ? Sleeping. ? Passing out (fainting). ? Getting upset easily (irritability). Treating low blood sugar To treat low blood sugar, eat or drink something sugary right away. If you can think clearly and swallow safely, follow the 15:15 rule:  Take 15 grams of a fast-acting carb (carbohydrate). Talk with your doctor about how much you should take.  Some fast-acting carbs are: ? Sugar tablets (glucose pills). Take 3-4 pills. ? 6-8 pieces of hard candy. ? 4-6 oz (120-150 mL) of fruit juice. ? 4-6 oz (120-150 mL) of regular (not diet) soda. ? 1 Tbsp (15 mL) honey or sugar.  Check your blood sugar 15 minutes after you take the carb.  If your blood sugar is still at or below 70 mg/dL (3.9 mmol/L), take 15 grams of a carb again.  If your blood sugar does not go above 70 mg/dL (3.9 mmol/L) after 3 tries, get help right away.  After your blood sugar goes back to normal, eat a meal or a snack within 1 hour. Treating very low blood sugar If your blood sugar is at or below 54 mg/dL (3 mmol/L), you have very low blood sugar (severe hypoglycemia). This is an emergency. Do not wait to see if the symptoms will go away. Get medical help right away. Call your local emergency services (911 in the [place] If you have very low  blood sugar and you cannot eat or drink, you may need a glucagon shot (injection). A family member or friend should learn how to check your blood sugar and how to give you a glucagon shot. Ask your doctor if you need to have a glucagon shot kit at home. Follow these instructions at home: Medicine  Take insulin and diabetes medicines as told.  If your doctor says you should take more or less insulin and medicines, do this exactly as told.  Do not run out of insulin or medicines. Having diabetes can raise your risk for other long-term conditions. These include heart disease and  kidney disease. Your doctor may prescribe medicines to help you not have these problems. Food   Make healthy food choices. These include: ? Chicken, fish, egg whites, and beans. ? Oats, whole wheat, bulgur, brown rice, quinoa, and millet. ? Fresh fruits and vegetables. ? Low-fat dairy products. ? Nuts, avocado, olive oil, and canola oil.  Meet with a food specialist (dietitian). He or she can help you make an eating plan that is right for you.  Follow instructions from your doctor about what you cannot eat or drink.  Drink enough fluid to keep your pee (urine) pale yellow.  Keep track of carbs that you eat. Do this by reading food labels and learning food serving sizes.  Follow your sick day plan when you cannot eat or drink normally. Make this plan with your doctor so it is ready to use. Activity  Exercise 3 or more times a week.  Do not go more than 2 days without exercising.  Talk with your doctor before you start a new exercise. Your doctor may need to tell you to change: ? How much insulin or medicines you take. ? How much food you eat. Lifestyle  Do not use any tobacco products. These include cigarettes, chewing tobacco, and e-cigarettes. If you need help quitting, ask your doctor.  Ask your doctor how much alcohol is safe for you.  Learn to deal with stress. If you need help with this, ask your doctor. Body care   Stay up to date with your shots (immunizations).  Have your eyes and feet checked by a doctor as often as told.  Check your skin and feet every day. Check for cuts, bruises, redness, blisters, or sores.  Brush your teeth and gums two times a day. Floss one or more times a day.  Go to the dentist one or more times every 6 months.  Stay at a healthy weight. General instructions  Take over-the-counter and prescription medicines only as told by your doctor.  Share your diabetes care plan with: ? Your work or school. ? People you live with.  Carry  a card or wear jewelry that says you have diabetes.  Keep all follow-up visits as told by your doctor. This is important. Questions to ask your doctor  Do I need to meet with a diabetes educator?  Where can I find a support group for people with diabetes? Where to find more information To learn more about diabetes, visit:  American Diabetes Association: www.diabetes.org  American Association of Diabetes Educators: www.diabeteseducator.org Summary  When you have type 2 diabetes, you must make sure your blood sugar (glucose) stays in a healthy range.  Check your blood sugar every day, as often as told.  Having diabetes can raise your risk for other conditions. Your doctor may prescribe medicines to help you not have these problems.  Keep all follow-up visits  as told by your doctor. This is important. This information is not intended to replace advice given to you by your health care provider. Make sure you discuss any questions you have with your health care provider. Document Released: 04/09/2016 Document Revised: 06/09/2018 Document Reviewed: 01/20/2016 Elsevier Patient Education  [employer].   If you have lab work done today you will be contacted with your lab results within the next 2 weeks.  If you have not heard from [place] then please contact us. The fastest way to get your results is to register for My Chart.   IF you received an x-ray today, you will receive an invoice from [facility]. Please contact [facility] at [phone] with questions or concerns regarding your invoice.   IF you received labwork today, you will receive an invoice from [facility]. Please contact [facility] at [phone] with questions or concerns regarding your invoice.   Our billing staff will not be able to assist you with questions regarding bills from these companies.  You will be contacted with the lab results as soon as they are available. The fastest way to get your  results is to activate your My Chart account. Instructions are located on the last page of this paperwork. If you have not heard from [place] regarding the results in 2 weeks, please contact this office.

## 2019-11-02 NOTE — Progress Notes (Signed)
Subjective:    Patient ID: Dennis Thomas, male    DOB: 04-28-1976, 43 y.o.   MRN: [id number]  HPI Dennis Thomas is a 43 y.o. male Presents today for: Chief Complaint  Patient presents with  . Leg Pain    3 wk f/u on left leg pain  . Follow-up    f/u on diabetes as well. Bs reading at home has been going down. Yday bs was 168   Diabetes: Associated with hyperglycemia, uncontrolled with prior medication nonadherence and seen in September.  Glipizide 5 mg twice daily prescribed October 12.  Continue Metformin.  Discussed potential need for insulin.  Microalbumin: Normal ratio September 28 Optho, foot exam, pneumovax: Discussed ophthalmology exam at last visit.will schedule in January.  Restart Lipitor 10 mg daily on 10/12 - rare dizzy, no myalgias.   Home readings are improving: Low 90, recent 168, 183, 194, 284, 173, 160, 129.145, 146, 158, 108, 90, 194. 161, 102, 197, 133.  Taking 1000 mg metformin QD (diarrhea if taking 2nd dose).  Taking glipizide twice per day before eating. No symptomatic lows.  Occasional HA in evening. No focal weakness. Not worst HA of life. Tx: none.  Has changed diet - less pasta and bread.  May be changing insurance coverage this year.    Lab Results  Component Value Date   HGBA1C 11.5 (H) 09/28/2019   HGBA1C 13.5 (A) 07/05/2018   HGBA1C 7.2 07/06/2013   Lab Results  Component Value Date   LDLCALC 165 (H) 09/28/2019   CREATININE 0.67 (L) 09/28/2019   Left knee pain: Possible superficial phlebitis of varicose veins, discussed in September.  Improved with use of heat, other symptomatic care at last visit. Doing a lot better, no mechanical symptoms. No calf or thigh pain. Still using heat at times.   Patient Active Problem List   Diagnosis Date Noted  . Ventral hernia 02/12/2012  . Uncontrolled diabetes mellitus ([place]) 02/03/2012   Past Medical History:  Diagnosis Date  . Diabetes mellitus   . Lower back pain   . Pain in the abdomen   .  Steatosis of liver   . Ventral hernia    Past Surgical History:  Procedure Laterality Date  . HERNIA REPAIR  02/20/2012   Ventral Hernia  . VENTRAL HERNIA REPAIR  03/26/2012   Procedure: HERNIA REPAIR VENTRAL ADULT;  Surgeon: Imogene Burn. Georgette Dover, MD;  Location: [facility];  Service: General;  Laterality: N/A;   No Known Allergies Prior to Admission medications   Medication Sig Start Date End Date Taking? Authorizing Provider  atorvastatin (LIPITOR) 10 MG tablet Take 1 tablet (10 mg total) by mouth daily. 10/12/19  Yes Wendie Agreste, MD  glipiZIDE (GLUCOTROL) 5 MG tablet Take 1 tablet (5 mg total) by mouth 2 (two) times daily before a meal. 10/12/19  Yes Wendie Agreste, MD  metFORMIN (GLUCOPHAGE) 1000 MG tablet Take one tab daily for a week with breakfast.  Then increase to one tab morning and night with food and continue. 09/28/19  Yes Wendie Agreste, MD   Social History   Socioeconomic History  . Marital status: Married    Spouse name: Not on file  . Number of children: Not on file  . Years of education: Not on file  . Highest education level: Not on file  Occupational History  . Not on file  Social Needs  . Financial resource strain: Not on file  . Food insecurity    Worry: Not on file  Inability: Not on file  . Transportation needs    Medical: Not on file    Non-medical: Not on file  Tobacco Use  . Smoking status: Never Smoker  . Smokeless tobacco: Never Used  Substance and Sexual Activity  . Alcohol use: No  . Drug use: No  . Sexual activity: Yes    Comment: married  Lifestyle  . Physical activity    Days per week: Not on file    Minutes per session: Not on file  . Stress: Not on file  Relationships  . Social Herbalist on phone: Not on file    Gets together: Not on file    Attends religious service: Not on file    Active member of club or organization: Not on file    Attends meetings of clubs or organizations: Not on file    Relationship status:  Not on file  . Intimate partner violence    Fear of current or ex partner: Not on file    Emotionally abused: Not on file    Physically abused: Not on file    Forced sexual activity: Not on file  Other Topics Concern  . Not on file  Social History Narrative  . Not on file    Review of Systems  Constitutional: Negative for fatigue and unexpected weight change.  Eyes: Negative for visual disturbance.  Respiratory: Negative for cough, chest tightness and shortness of breath.   Cardiovascular: Negative for chest pain, palpitations and leg swelling.  Gastrointestinal: Negative for abdominal pain and blood in stool.  Neurological: Negative for dizziness, light-headedness and headaches.       Objective:   Physical Exam Vitals signs reviewed.  Constitutional:      Appearance: He is well-developed.  HENT:     Head: Normocephalic and atraumatic.  Eyes:     Pupils: Pupils are equal, round, and reactive to light.  Neck:     Vascular: No carotid bruit or JVD.  Cardiovascular:     Rate and Rhythm: Normal rate and regular rhythm.     Heart sounds: Normal heart sounds. No murmur.  Pulmonary:     Effort: Pulmonary effort is normal.     Breath sounds: Normal breath sounds. No rales.  Skin:    General: Skin is warm and dry.  Neurological:     Mental Status: He is alert and oriented to person, place, and time.    Vitals:   11/02/19 1011  BP: 128/83  Pulse: 76  Temp: 98.3 F (36.8 C)  TempSrc: Oral  SpO2: 99%  Weight: 212 lb 12.8 oz (96.5 kg)          Assessment & Plan:    Dennis Thomas is a 43 y.o. male Uncontrolled type 2 diabetes mellitus with hyperglycemia ([place]) - Plan: POCT glucose (manual entry), metFORMIN (GLUCOPHAGE) 500 MG tablet -Improving.  Cautioned on hypoglycemia with sulfonylureas.  Change Metformin to twice daily as diarrhea with higher dose.  Recheck 6 weeks with repeat labs, no other changes for now.  Meds ordered this encounter  Medications  .  metFORMIN (GLUCOPHAGE) 500 MG tablet    Sig: Take 1 tablet (500 mg total) by mouth 2 (two) times daily with a meal.    Dispense:  180 tablet    Refill:  1   Patient Instructions    Home readings look much better.  Change Metformin to 500 mg twice per day.  If you have diarrhea on that dosing, let me know.  Glipizide twice per day with meals, but watch for low blood sugars.  See information below.  Recheck in approximately 6 weeks for repeat test.  Thank you for coming in today.   Return to the clinic or go to the nearest emergency room if any of your symptoms worsen or new symptoms occur.   Type 2 Diabetes Mellitus, Self Care, Adult When you have type 2 diabetes (type 2 diabetes mellitus), you must make sure your blood sugar (glucose) stays in a healthy range. You can do this with:  Nutrition.  Exercise.  Lifestyle changes.  Medicines or insulin, if needed.  Support from your doctors and others. How to stay aware of blood sugar   Check your blood sugar level every day, as often as told.  Have your A1c (hemoglobin A1c) level checked two or more times a year. Have it checked more often if your doctor tells you to. Your doctor will set personal treatment goals for you. Generally, you should have these blood sugar levels:  Before meals (preprandial): 80-130 mg/dL (4.4-7.2 mmol/L).  After meals (postprandial): below 180 mg/dL (10 mmol/L).  A1c level: less than 7%. How to manage high and low blood sugar Signs of high blood sugar High blood sugar is called hyperglycemia. Know the signs of high blood sugar. Signs may include:  Feeling: ? Thirsty. ? Hungry. ? Very tired.  Needing to pee (urinate) more than usual.  Blurry vision. Signs of low blood sugar Low blood sugar is called hypoglycemia. This is when blood sugar is at or below 70 mg/dL (3.9 mmol/L). Signs may include:  Feeling: ? Hungry. ? Worried or nervous (anxious). ? Sweaty and clammy. ? Confused. ? Dizzy. ?  Sleepy. ? Sick to your stomach (nauseous).  Having: ? A fast heartbeat. ? A headache. ? A change in your vision. ? Jerky movements that you cannot control (seizure). ? Tingling or no feeling (numbness) around your mouth, lips, or tongue.  Having trouble with: ? Moving (coordination). ? Sleeping. ? Passing out (fainting). ? Getting upset easily (irritability). Treating low blood sugar To treat low blood sugar, eat or drink something sugary right away. If you can think clearly and swallow safely, follow the 15:15 rule:  Take 15 grams of a fast-acting carb (carbohydrate). Talk with your doctor about how much you should take.  Some fast-acting carbs are: ? Sugar tablets (glucose pills). Take 3-4 pills. ? 6-8 pieces of hard candy. ? 4-6 oz (120-150 mL) of fruit juice. ? 4-6 oz (120-150 mL) of regular (not diet) soda. ? 1 Tbsp (15 mL) honey or sugar.  Check your blood sugar 15 minutes after you take the carb.  If your blood sugar is still at or below 70 mg/dL (3.9 mmol/L), take 15 grams of a carb again.  If your blood sugar does not go above 70 mg/dL (3.9 mmol/L) after 3 tries, get help right away.  After your blood sugar goes back to normal, eat a meal or a snack within 1 hour. Treating very low blood sugar If your blood sugar is at or below 54 mg/dL (3 mmol/L), you have very low blood sugar (severe hypoglycemia). This is an emergency. Do not wait to see if the symptoms will go away. Get medical help right away. Call your local emergency services (911 in the [place] If you have very low blood sugar and you cannot eat or drink, you may need a glucagon shot (injection). A family member or friend should learn how to check your  blood sugar and how to give you a glucagon shot. Ask your doctor if you need to have a glucagon shot kit at home. Follow these instructions at home: Medicine  Take insulin and diabetes medicines as told.  If your doctor says you should take more or less insulin  and medicines, do this exactly as told.  Do not run out of insulin or medicines. Having diabetes can raise your risk for other long-term conditions. These include heart disease and kidney disease. Your doctor may prescribe medicines to help you not have these problems. Food   Make healthy food choices. These include: ? Chicken, fish, egg whites, and beans. ? Oats, whole wheat, bulgur, brown rice, quinoa, and millet. ? Fresh fruits and vegetables. ? Low-fat dairy products. ? Nuts, avocado, olive oil, and canola oil.  Meet with a food specialist (dietitian). He or she can help you make an eating plan that is right for you.  Follow instructions from your doctor about what you cannot eat or drink.  Drink enough fluid to keep your pee (urine) pale yellow.  Keep track of carbs that you eat. Do this by reading food labels and learning food serving sizes.  Follow your sick day plan when you cannot eat or drink normally. Make this plan with your doctor so it is ready to use. Activity  Exercise 3 or more times a week.  Do not go more than 2 days without exercising.  Talk with your doctor before you start a new exercise. Your doctor may need to tell you to change: ? How much insulin or medicines you take. ? How much food you eat. Lifestyle  Do not use any tobacco products. These include cigarettes, chewing tobacco, and e-cigarettes. If you need help quitting, ask your doctor.  Ask your doctor how much alcohol is safe for you.  Learn to deal with stress. If you need help with this, ask your doctor. Body care   Stay up to date with your shots (immunizations).  Have your eyes and feet checked by a doctor as often as told.  Check your skin and feet every day. Check for cuts, bruises, redness, blisters, or sores.  Brush your teeth and gums two times a day. Floss one or more times a day.  Go to the dentist one or more times every 6 months.  Stay at a healthy weight. General  instructions  Take over-the-counter and prescription medicines only as told by your doctor.  Share your diabetes care plan with: ? Your work or school. ? People you live with.  Carry a card or wear jewelry that says you have diabetes.  Keep all follow-up visits as told by your doctor. This is important. Questions to ask your doctor  Do I need to meet with a diabetes educator?  Where can I find a support group for people with diabetes? Where to find more information To learn more about diabetes, visit:  American Diabetes Association: www.diabetes.org  American Association of Diabetes Educators: www.diabeteseducator.org Summary  When you have type 2 diabetes, you must make sure your blood sugar (glucose) stays in a healthy range.  Check your blood sugar every day, as often as told.  Having diabetes can raise your risk for other conditions. Your doctor may prescribe medicines to help you not have these problems.  Keep all follow-up visits as told by your doctor. This is important. This information is not intended to replace advice given to you by your health care provider. Make sure  you discuss any questions you have with your health care provider. Document Released: 04/09/2016 Document Revised: 06/09/2018 Document Reviewed: 01/20/2016 Elsevier Patient Education  [employer].   If you have lab work done today you will be contacted with your lab results within the next 2 weeks.  If you have not heard from [place] then please contact us. The fastest way to get your results is to register for My Chart.   IF you received an x-ray today, you will receive an invoice from [facility]. Please contact [facility] at [phone] with questions or concerns regarding your invoice.   IF you received labwork today, you will receive an invoice from [facility]. Please contact [facility] at [phone] with questions or concerns regarding your invoice.   Our billing staff  will not be able to assist you with questions regarding bills from these companies.  You will be contacted with the lab results as soon as they are available. The fastest way to get your results is to activate your My Chart account. Instructions are located on the last page of this paperwork. If you have not heard from [place] regarding the results in 2 weeks, please contact this office.       Signed,   Merri Ray, MD [facility].  11/02/19 11:37 AM

## 2019-11-03 ENCOUNTER — Encounter: Payer: Self-pay | Admitting: Family Medicine

## 2019-11-11 ENCOUNTER — Other Ambulatory Visit: Payer: Self-pay

## 2019-11-11 ENCOUNTER — Encounter (HOSPITAL_COMMUNITY): Payer: Self-pay

## 2019-11-11 ENCOUNTER — Emergency Department (HOSPITAL_COMMUNITY): Payer: Self-pay

## 2019-11-11 ENCOUNTER — Emergency Department (HOSPITAL_COMMUNITY)
Admission: EM | Admit: 2019-11-11 | Discharge: 2019-11-11 | Disposition: A | Discharge status: Home / Self Care | Payer: Self-pay | Attending: Emergency Medicine | Admitting: Emergency Medicine

## 2019-11-11 DIAGNOSIS — R002 Palpitations: Secondary | ICD-10-CM | POA: Insufficient documentation

## 2019-11-11 DIAGNOSIS — Z7984 Long term (current) use of oral hypoglycemic drugs: Secondary | ICD-10-CM | POA: Insufficient documentation

## 2019-11-11 DIAGNOSIS — Z79899 Other long term (current) drug therapy: Secondary | ICD-10-CM | POA: Insufficient documentation

## 2019-11-11 DIAGNOSIS — E119 Type 2 diabetes mellitus without complications: Secondary | ICD-10-CM | POA: Insufficient documentation

## 2019-11-11 LAB — D-DIMER, QUANTITATIVE: D-Dimer, Quant: 0.28 ug/mL-FEU (ref 0.00–0.50)

## 2019-11-11 LAB — I-STAT CHEM 8, ED
BUN: 15 mg/dL (ref 6–20)
Calcium, Ion: 1.23 mmol/L (ref 1.15–1.40)
Chloride: 100 mmol/L (ref 98–111)
Creatinine, Ser: 0.5 mg/dL — ABNORMAL LOW (ref 0.61–1.24)
Glucose, Bld: 126 mg/dL — ABNORMAL HIGH (ref 70–99)
HCT: 43 % (ref 39.0–52.0)
Hemoglobin: 14.6 g/dL (ref 13.0–17.0)
Potassium: 3.4 mmol/L — ABNORMAL LOW (ref 3.5–5.1)
Sodium: 137 mmol/L (ref 135–145)
TCO2: 23 mmol/L (ref 22–32)

## 2019-11-11 LAB — CBG MONITORING, ED: Glucose-Capillary: 129 mg/dL — ABNORMAL HIGH (ref 70–99)

## 2019-11-11 LAB — CBC WITH DIFFERENTIAL/PLATELET
Abs Immature Granulocytes: 0.05 10*3/uL (ref 0.00–0.07)
Basophils Absolute: 0 10*3/uL (ref 0.0–0.1)
Basophils Relative: 0 %
Eosinophils Absolute: 0.1 10*3/uL (ref 0.0–0.5)
Eosinophils Relative: 1 %
HCT: 45 % (ref 39.0–52.0)
Hemoglobin: 14.9 g/dL (ref 13.0–17.0)
Immature Granulocytes: 0 %
Lymphocytes Relative: 25 %
Lymphs Abs: 2.8 10*3/uL (ref 0.7–4.0)
MCH: 29 pg (ref 26.0–34.0)
MCHC: 33.1 g/dL (ref 30.0–36.0)
MCV: 87.7 fL (ref 80.0–100.0)
Monocytes Absolute: 0.9 10*3/uL (ref 0.1–1.0)
Monocytes Relative: 8 %
Neutro Abs: 7.3 10*3/uL (ref 1.7–7.7)
Neutrophils Relative %: 66 %
Platelets: 264 10*3/uL (ref 150–400)
RBC: 5.13 MIL/uL (ref 4.22–5.81)
RDW: 13.1 % (ref 11.5–15.5)
WBC: 11.2 10*3/uL — ABNORMAL HIGH (ref 4.0–10.5)
nRBC: 0 % (ref 0.0–0.2)

## 2019-11-11 LAB — TROPONIN I (HIGH SENSITIVITY)
Troponin I (High Sensitivity): 4 ng/L (ref <18)
Troponin I (High Sensitivity): 4 ng/L (ref <18)

## 2019-11-11 NOTE — ED Provider Notes (Signed)
[facility] DEPT Provider Note   CSN: [id number] Arrival date & time: 11/11/19  0033     History   Chief Complaint Chief Complaint  Patient presents with  . Dizziness  . Palpitations    HPI Dennis Thomas is a 42 y.o. male.     The history is provided by the patient.  Dizziness Quality:  Lightheadedness Severity:  Moderate Onset quality:  Gradual Timing:  Constant Progression:  Resolved Chronicity:  New Context: not when bending over, not with bowel movement, not with ear pain and not with eye movement   Context comment:  Palpitations Relieved by:  Nothing Worsened by:  Nothing Ineffective treatments:  None tried Associated symptoms: palpitations   Associated symptoms: no chest pain and no shortness of breath   Risk factors: no anemia, no hx of vertigo and no Meniere's disease   Palpitations Palpitations quality:  Regular Onset quality:  Gradual Timing:  Constant Progression:  Resolved Chronicity:  Recurrent Context: not appetite suppressants, not blood loss, not dehydration, not exercise, not illicit drugs, not nicotine and not stimulant use   Relieved by:  Nothing Worsened by:  Nothing Ineffective treatments:  None tried Associated symptoms: no chest pain, no cough, no diaphoresis, no dizziness and no shortness of breath   Risk factors: diabetes mellitus   Recurrent palpitations and lightheadedness at rest.  Got better when he went to his sisters because he was scared.  No f/c/r.  No CP no DOE.    Past Medical History:  Diagnosis Date  . Diabetes mellitus   . Lower back pain   . Pain in the abdomen   . Steatosis of liver   . Ventral hernia     Patient Active Problem List   Diagnosis Date Noted  . Ventral hernia 02/12/2012  . Uncontrolled diabetes mellitus ([place]) 02/03/2012    Past Surgical History:  Procedure Laterality Date  . HERNIA REPAIR  02/20/2012   Ventral Hernia  . VENTRAL HERNIA REPAIR  03/26/2012   Procedure: HERNIA REPAIR VENTRAL ADULT;  Surgeon: Imogene Burn. Georgette Dover, MD;  Location: [facility] ORS;  Service: General;  Laterality: N/A;        Home Medications    Prior to Admission medications   Medication Sig Start Date End Date Taking? Authorizing Provider  atorvastatin (LIPITOR) 10 MG tablet Take 1 tablet (10 mg total) by mouth daily. 10/12/19  Yes Wendie Agreste, MD  glipiZIDE (GLUCOTROL) 5 MG tablet Take 1 tablet (5 mg total) by mouth 2 (two) times daily before a meal. 10/12/19  Yes Wendie Agreste, MD  metFORMIN (GLUCOPHAGE) 500 MG tablet Take 1 tablet (500 mg total) by mouth 2 (two) times daily with a meal. 11/02/19  Yes Wendie Agreste, MD    Family History History reviewed. No pertinent family history.  Social History Social History   Tobacco Use  . Smoking status: Never Smoker  . Smokeless tobacco: Never Used  Substance Use Topics  . Alcohol use: No  . Drug use: No     Allergies   Patient has no known allergies.   Review of Systems Review of Systems  Constitutional: Negative for diaphoresis and fever.  HENT: Negative for congestion.   Eyes: Negative for visual disturbance.  Respiratory: Negative for cough, choking and shortness of breath.   Cardiovascular: Positive for palpitations. Negative for chest pain and leg swelling.  Genitourinary: Negative for difficulty urinating.  Musculoskeletal: Negative for arthralgias.  Skin: Negative for color change.  Neurological: Positive for  light-headedness. Negative for dizziness.  Psychiatric/Behavioral: Negative for agitation.  All other systems reviewed and are negative.    Physical Exam Updated Vital Signs BP 116/86   Pulse 72   Temp 98.2 F (36.8 C) (Oral)   Resp (!) 22   Ht 5\' 9"  (1.753 m)   Wt 96.2 kg   SpO2 95%   BMI 31.31 kg/m   Physical Exam Vitals signs and nursing note reviewed.  Constitutional:      General: He is not in acute distress.    Appearance: He is normal weight.  HENT:     Head:  Normocephalic and atraumatic.     Nose: Nose normal.  Eyes:     Conjunctiva/sclera: Conjunctivae normal.     Pupils: Pupils are equal, round, and reactive to light.  Neck:     Musculoskeletal: Normal range of motion and neck supple.  Cardiovascular:     Rate and Rhythm: Normal rate and regular rhythm.     Pulses: Normal pulses.     Heart sounds: Normal heart sounds.  Pulmonary:     Effort: Pulmonary effort is normal.     Breath sounds: Normal breath sounds.  Abdominal:     General: Abdomen is flat. Bowel sounds are normal.     Tenderness: There is no abdominal tenderness. There is no guarding or rebound.     Hernia: No hernia is present.  Musculoskeletal: Normal range of motion.  Skin:    General: Skin is warm and dry.     Capillary Refill: Capillary refill takes less than 2 seconds.  Neurological:     General: No focal deficit present.     Mental Status: He is alert and oriented to person, place, and time.     Deep Tendon Reflexes: Reflexes normal.  Psychiatric:        Mood and Affect: Mood is anxious.      ED Treatments / Results  Labs (all labs ordered are listed, but only abnormal results are displayed) Results for orders placed or performed during the hospital encounter of 11/11/19  CBC with Differential  Result Value Ref Range   WBC 11.2 (H) 4.0 - 10.5 K/uL   RBC 5.13 4.22 - 5.81 MIL/uL   Hemoglobin 14.9 13.0 - 17.0 g/dL   HCT 13/11/20 51.0 - 25.8 %   MCV 87.7 80.0 - 100.0 fL   MCH 29.0 26.0 - 34.0 pg   MCHC 33.1 30.0 - 36.0 g/dL   RDW 52.7 78.2 - 42.3 %   Platelets 264 150 - 400 K/uL   nRBC 0.0 0.0 - 0.2 %   Neutrophils Relative % 66 %   Neutro Abs 7.3 1.7 - 7.7 K/uL   Lymphocytes Relative 25 %   Lymphs Abs 2.8 0.7 - 4.0 K/uL   Monocytes Relative 8 %   Monocytes Absolute 0.9 0.1 - 1.0 K/uL   Eosinophils Relative 1 %   Eosinophils Absolute 0.1 0.0 - 0.5 K/uL   Basophils Relative 0 %   Basophils Absolute 0.0 0.0 - 0.1 K/uL   Immature Granulocytes 0 %   Abs  Immature Granulocytes 0.05 0.00 - 0.07 K/uL  D-dimer, quantitative (not at [facility])  Result Value Ref Range   D-Dimer, Quant 0.28 0.00 - 0.50 ug/mL-FEU  CBG monitoring, ED  Result Value Ref Range   Glucose-Capillary 129 (H) 70 - 99 mg/dL  I-stat chem 8, ED (not at [facility] or [facility])  Result Value Ref Range   Sodium 137 135 - 145 mmol/L  Potassium 3.4 (L) 3.5 - 5.1 mmol/L   Chloride 100 98 - 111 mmol/L   BUN 15 6 - 20 mg/dL   Creatinine, Ser 1.61 (L) 0.61 - 1.24 mg/dL   Glucose, Bld 096 (H) 70 - 99 mg/dL   Calcium, Ion 0.45 4.09 - 1.40 mmol/L   TCO2 23 22 - 32 mmol/L   Hemoglobin 14.6 13.0 - 17.0 g/dL   HCT 81.1 91.4 - 78.2 %  Troponin I (High Sensitivity)  Result Value Ref Range   Troponin I (High Sensitivity) 4 <18 ng/L  Troponin I (High Sensitivity)  Result Value Ref Range   Troponin I (High Sensitivity) 4 <18 ng/L   Dg Chest Portable 1 View  Result Date: 11/11/2019 CLINICAL DATA:  Cardiac palpitations and chest pain EXAM: PORTABLE CHEST 1 VIEW COMPARISON:  None. FINDINGS: The heart size and mediastinal contours are within normal limits. Both lungs are clear. The visualized skeletal structures are unremarkable. IMPRESSION: No active disease. Electronically Signed   By: Alcide Clever M.D.   On: 11/11/2019 01:11    EKG EKG Interpretation  Date/Time:  Wednesday November 11 2019 00:46:52 EST Ventricular Rate:  78 PR Interval:    QRS Duration: 102 QT Interval:  387 QTC Calculation: 441 R Axis:   25 Text Interpretation: Sinus rhythm Confirmed by Alayla Dethlefs ([id number]) on 11/11/2019 3:42:29 AM   Radiology Dg Chest Portable 1 View  Result Date: 11/11/2019 CLINICAL DATA:  Cardiac palpitations and chest pain EXAM: PORTABLE CHEST 1 VIEW COMPARISON:  None. FINDINGS: The heart size and mediastinal contours are within normal limits. Both lungs are clear. The visualized skeletal structures are unremarkable. IMPRESSION: No active disease. Electronically Signed   By: Alcide Clever M.D.   On:  11/11/2019 01:11    Procedures Procedures (including critical care time)  Medications Ordered in ED Medications - No data to display   Initial Impression / Assessment and Plan / ED Course  Low risk for PE and ruled out with a negative ddimer.  Ruled out for MI.  HEART score is 2 low risk for MACE.  I suspect this is anxiety.  Follow up with your PMD for holter monitor.    Dennis Thomas was evaluated in Emergency Department on 11/11/2019 for the symptoms described in the history of present illness. He was evaluated in the context of the global COVID-19 pandemic, which necessitated consideration that the patient might be at risk for infection with the SARS-CoV-2 virus that causes COVID-19. Institutional protocols and algorithms that pertain to the evaluation of patients at risk for COVID-19 are in a state of rapid change based on information released by regulatory bodies including the CDC and federal and state organizations. These policies and algorithms were followed during the patient's care in the ED.   Final Clinical Impressions(s) / ED Diagnoses    Return for intractable cough, coughing up blood,fevers >100.4 unrelieved by medication, shortness of breath, intractable vomiting, chest pain, shortness of breath, weakness,numbness, changes in speech, facial asymmetry,abdominal pain, passing out,Inability to tolerate liquids or food, cough, altered mental status or any concerns. No signs of systemic illness or infection. The patient is nontoxic-appearing on exam and vital signs are within normal limits.   I have reviewed the triage vital signs and the nursing notes. Pertinent labs &imaging results that were available during my care of the patient were reviewed by me and considered in my medical decision making (see chart for details).  After history, exam, and medical workup I feel the patient has  been appropriately medically screened and is safe for discharge home. Pertinent  diagnoses were discussed with the patient. Patient was given return precautions   Hilda Rynders, MD 11/11/19 [id number]

## 2019-11-11 NOTE — ED Notes (Signed)
Pt verbalized discharge instructions and follow up care. Alert and ambulatory. No IV. Leaving with wife 

## 2019-11-11 NOTE — ED Triage Notes (Signed)
Per ems: Pt coming from mother's house c/o "feeling like I am going to die". Pt woke up with "his heart beating out of his chest." No hx of anxiety or cardiac. C/o of dizziness and feeling hot. Symptoms have resolved now.   [id number] 129/86 rr-18 100 room air 74 hr

## 2019-12-14 ENCOUNTER — Ambulatory Visit: Payer: Self-pay | Admitting: Family Medicine

## 2019-12-18 ENCOUNTER — Ambulatory Visit: Payer: Self-pay | Admitting: Family Medicine

## 2019-12-21 ENCOUNTER — Ambulatory Visit (INDEPENDENT_AMBULATORY_CARE_PROVIDER_SITE_OTHER): Payer: Self-pay | Admitting: Family Medicine

## 2019-12-21 ENCOUNTER — Encounter: Payer: Self-pay | Admitting: Family Medicine

## 2019-12-21 ENCOUNTER — Other Ambulatory Visit: Payer: Self-pay

## 2019-12-21 VITALS — BP 100/80 | HR 82 | Temp 97.9°F | Ht 69.0 in | Wt 216.6 lb

## 2019-12-21 DIAGNOSIS — E785 Hyperlipidemia, unspecified: Secondary | ICD-10-CM

## 2019-12-21 DIAGNOSIS — E1165 Type 2 diabetes mellitus with hyperglycemia: Secondary | ICD-10-CM

## 2019-12-21 DIAGNOSIS — G4700 Insomnia, unspecified: Secondary | ICD-10-CM

## 2019-12-21 NOTE — Progress Notes (Signed)
Subjective:  Patient ID: Dennis Thomas, male    DOB: December 13, 1976  Age: 43 y.o. MRN: [id number]  CC:  Chief Complaint  Patient presents with  . Follow-up    diabetes. Pt declined flu vac  . Insomnia    Pt stated that he noticed in the last 3/4 mos he is having trouble staying asleep    HPI Dennis Thomas presents for   Diabetes: Complicated by hyperglycemia, prior medication nonadherence..  Improving at November visit.  Possible hypoglycemia with sulfonylureas.  Metformin was changed to twice daily as had diarrhea with higher dose.  Continue glipizide twice per day, 5 mg. Less diarrhea. With  dosing BID of metformin.  No symptomatic lows. 120-160's. No 200's.Currently on statin, on Lipitor 10 mg daily now.  Due for repeat labs. No new myalgias.  Microalbumin: Normal ratio September 28 Optho, foot exam, pneumovax: Due for ophthalmology exam.  Discussed last visit - plans on first of the year with insurance coverage.   Lab Results  Component Value Date   HGBA1C 11.5 (H) 09/28/2019   HGBA1C 13.5 (A) 07/05/2018   HGBA1C 7.2 07/06/2013   Lab Results  Component Value Date   LDLCALC 165 (H) 09/28/2019   CREATININE 0.50 (L) 11/11/2019    Insomnia: Goes to bed at 9:30. Sleeps to 1:30 or 2 am. Hard time getting back to sleep until 4:30 to 6:30 sleep, then up for work. Notes problems past 2-3 months.  Some stress with job. 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  Occasional snoring, not nightly. No known pauses.  Drinking fluids at bedtime, but not having to urinate when waking.  Green tea after dinner. No soda.  Tx: none.   History Patient Active Problem List   Diagnosis Date Noted  . Ventral hernia 02/12/2012  . Uncontrolled diabetes mellitus (HCC) 02/03/2012   Past Medical History:  Diagnosis Date  . Diabetes mellitus   . Lower back pain   . Pain in the abdomen   . Steatosis of liver   . Ventral hernia    Past Surgical History:  Procedure Laterality  Date  . HERNIA REPAIR  02/20/2012   Ventral Hernia  . VENTRAL HERNIA REPAIR  03/26/2012   Procedure: HERNIA REPAIR VENTRAL ADULT;  Surgeon: Wilmon Arms. Corliss Skains, MD;  Location: [facility];  Service: General;  Laterality: N/A;   No Known Allergies Prior to Admission medications   Medication Sig Start Date End Date Taking? Authorizing Provider  atorvastatin (LIPITOR) 10 MG tablet Take 1 tablet (10 mg total) by mouth daily. 10/12/19   Shade Flood, MD  glipiZIDE (GLUCOTROL) 5 MG tablet Take 1 tablet (5 mg total) by mouth 2 (two) times daily before a meal. 10/12/19   Shade Flood, MD  metFORMIN (GLUCOPHAGE) 500 MG tablet Take 1 tablet (500 mg total) by mouth 2 (two) times daily with a meal. 11/02/19   Shade Flood, MD   Social History   Socioeconomic History  . Marital status: Married    Spouse name: Not on file  . Number of children: Not on file  . Years of education: Not on file  . Highest education level: Not on file  Occupational History  . Not on file  Tobacco Use  . Smoking status: Never Smoker  . Smokeless tobacco: Never Used  Substance and Sexual Activity  . Alcohol use: No  . Drug use: No  . Sexual activity: Yes    Comment: married  Other Topics Concern  . Not on  file  Social History Narrative  . Not on file   Social Determinants of Health   Financial Resource Strain:   . Difficulty of Paying Living Expenses: Not on file  Food Insecurity:   . Worried About Programme researcher, broadcasting/film/videounning Out of Food in the Last Year: Not on file  . Ran Out of Food in the Last Year: Not on file  Transportation Needs:   . Lack of Transportation (Medical): Not on file  . Lack of Transportation (Non-Medical): Not on file  Physical Activity:   . Days of Exercise per Week: Not on file  . Minutes of Exercise per Session: Not on file  Stress:   . Feeling of Stress : Not on file  Social Connections:   . Frequency of Communication with Friends and Family: Not on file  . Frequency of Social Gatherings with  Friends and Family: Not on file  . Attends Religious Services: Not on file  . Active Member of Clubs or Organizations: Not on file  . Attends BankerClub or Organization Meetings: Not on file  . Marital Status: Not on file  Intimate Partner Violence:   . Fear of Current or Ex-Partner: Not on file  . Emotionally Abused: Not on file  . Physically Abused: Not on file  . Sexually Abused: Not on file    Review of Systems  Constitutional: Negative for fatigue and unexpected weight change.  Eyes: Negative for visual disturbance.  Respiratory: Negative for cough, chest tightness and shortness of breath.   Cardiovascular: Negative for chest pain, palpitations and leg swelling.  Gastrointestinal: Negative for abdominal pain and blood in stool.  Neurological: Negative for dizziness, light-headedness and headaches.     Objective:   Vitals:   12/21/19 1140  BP: 100/80  Pulse: 82  Temp: 97.9 F (36.6 C)  TempSrc: Temporal  SpO2: 100%  Weight: 216 lb 9.6 oz (98.2 kg)  Height: 5\' 9"  (1.753 m)     Physical Exam Vitals reviewed.  Constitutional:      Appearance: He is well-developed.  HENT:     Head: Normocephalic and atraumatic.  Eyes:     Pupils: Pupils are equal, round, and reactive to light.  Neck:     Vascular: No carotid bruit or JVD.  Cardiovascular:     Rate and Rhythm: Normal rate and regular rhythm.     Heart sounds: Normal heart sounds. No murmur.  Pulmonary:     Effort: Pulmonary effort is normal.     Breath sounds: Normal breath sounds. No rales.  Skin:    General: Skin is warm and dry.  Neurological:     Mental Status: He is alert and oriented to person, place, and time.        Assessment & Plan:  Dennis Thomas is a 43 y.o. male . Uncontrolled type 2 diabetes mellitus with hyperglycemia (HCC) - Plan: Hemoglobin A1c  -Improving numbers at home.  Continue Metformin, glipizide same dose for now.  Check A1c.  If additional control needed, consider SGLT2 or GLP-1,  which may be more cost effective early next year with insurance coverage.  Hyperlipidemia, unspecified hyperlipidemia type - Plan: Lipid Panel, Comprehensive metabolic panel  -Tolerating Lipitor, continue same, recheck labs.  Insomnia, unspecified type  -May have component of stress from job, but denies feeling stressed.  Decrease caffeine after dinner, decrease fluids close to bedtime, melatonin discussed, Tylenol PM if needed temporarily, other sleep hygiene reviewed and handout given.  Follow-up if not improving in the next 1 month.  No orders of the defined types were placed in this encounter.  Patient Instructions     Try melatonin for sleep and see other info on sleep hygiene.  Try drinking water earlier in the day and less before bedtime (1-2 hours before bedtime).  Avoid caffeine in the evening.  Tylenol PM if needed, but if sleep not improving in next month follow up to look into other causes.   No change in medications for now, but I will check the hemoglobin A1c test today.  Potentially we can look at making changes next few weeks if better for cost of medications.  Thank you for coming in today, let me know if there are questions.  Insomnia Insomnia is a sleep disorder that makes it difficult to fall asleep or stay asleep. Insomnia can cause fatigue, low energy, difficulty concentrating, mood swings, and poor performance at work or school. There are three different ways to classify insomnia:  Difficulty falling asleep.  Difficulty staying asleep.  Waking up too early in the morning. Any type of insomnia can be long-term (chronic) or short-term (acute). Both are common. Short-term insomnia usually lasts for three months or less. Chronic insomnia occurs at least three times a week for longer than three months. What are the causes? Insomnia may be caused by another condition, situation, or substance, such as:  Anxiety.  Certain medicines.  Gastroesophageal reflux  disease (GERD) or other gastrointestinal conditions.  Asthma or other breathing conditions.  Restless legs syndrome, sleep apnea, or other sleep disorders.  Chronic pain.  Menopause.  Stroke.  Abuse of alcohol, tobacco, or illegal drugs.  Mental health conditions, such as depression.  Caffeine.  Neurological disorders, such as Alzheimer's disease.  An overactive thyroid (hyperthyroidism). Sometimes, the cause of insomnia may not be known. What increases the risk? Risk factors for insomnia include:  Gender. Women are affected more often than men.  Age. Insomnia is more common as you get older.  Stress.  Lack of exercise.  Irregular work schedule or working night shifts.  Traveling between different time zones.  Certain medical and mental health conditions. What are the signs or symptoms? If you have insomnia, the main symptom is having trouble falling asleep or having trouble staying asleep. This may lead to other symptoms, such as:  Feeling fatigued or having low energy.  Feeling nervous about going to sleep.  Not feeling rested in the morning.  Having trouble concentrating.  Feeling irritable, anxious, or depressed. How is this diagnosed? This condition may be diagnosed based on:  Your symptoms and medical history. Your health care provider may ask about: ? Your sleep habits. ? Any medical conditions you have. ? Your mental health.  A physical exam. How is this treated? Treatment for insomnia depends on the cause. Treatment may focus on treating an underlying condition that is causing insomnia. Treatment may also include:  Medicines to help you sleep.  Counseling or therapy.  Lifestyle adjustments to help you sleep better. Follow these instructions at home: Eating and drinking   Limit or avoid alcohol, caffeinated beverages, and cigarettes, especially close to bedtime. These can disrupt your sleep.  Do not eat a large meal or eat spicy foods  right before bedtime. This can lead to digestive discomfort that can make it hard for you to sleep. Sleep habits   Keep a sleep diary to help you and your health care provider figure out what could be causing your insomnia. Write down: ? When you sleep. ? When you  wake up during the night. ? How well you sleep. ? How rested you feel the next day. ? Any side effects of medicines you are taking. ? What you eat and drink.  Make your bedroom a dark, comfortable place where it is easy to fall asleep. ? Put up shades or blackout curtains to block light from outside. ? Use a white noise machine to block noise. ? Keep the temperature cool.  Limit screen use before bedtime. This includes: ? Watching TV. ? Using your smartphone, tablet, or computer.  Stick to a routine that includes going to bed and waking up at the same times every day and night. This can help you fall asleep faster. Consider making a quiet activity, such as reading, part of your nighttime routine.  Try to avoid taking naps during the day so that you sleep better at night.  Get out of bed if you are still awake after 15 minutes of trying to sleep. Keep the lights down, but try reading or doing a quiet activity. When you feel sleepy, go back to bed. General instructions  Take over-the-counter and prescription medicines only as told by your health care provider.  Exercise regularly, as told by your health care provider. Avoid exercise starting several hours before bedtime.  Use relaxation techniques to manage stress. Ask your health care provider to suggest some techniques that may work well for you. These may include: ? Breathing exercises. ? Routines to release muscle tension. ? Visualizing peaceful scenes.  Make sure that you drive carefully. Avoid driving if you feel very sleepy.  Keep all follow-up visits as told by your health care provider. This is important. Contact a health care provider if:  You are tired  throughout the day.  You have trouble in your daily routine due to sleepiness.  You continue to have sleep problems, or your sleep problems get worse. Get help right away if:  You have serious thoughts about hurting yourself or someone else. If you ever feel like you may hurt yourself or others, or have thoughts about taking your own life, get help right away. You can go to your nearest emergency department or call:  Your local emergency services (911 in the [place]  A suicide crisis helpline, such as the National Suicide Prevention Lifeline at 3156053628. This is open 24 hours a day. Summary  Insomnia is a sleep disorder that makes it difficult to fall asleep or stay asleep.  Insomnia can be long-term (chronic) or short-term (acute).  Treatment for insomnia depends on the cause. Treatment may focus on treating an underlying condition that is causing insomnia.  Keep a sleep diary to help you and your health care provider figure out what could be causing your insomnia. This information is not intended to replace advice given to you by your health care provider. Make sure you discuss any questions you have with your health care provider. Document Released: 12/14/2000 Document Revised: 11/29/2017 Document Reviewed: 09/26/2017 Elsevier Patient Education  [employer].   If you have lab work done today you will be contacted with your lab results within the next 2 weeks.  If you have not heard from [place] then please contact us. The fastest way to get your results is to register for My Chart.   IF you received an x-ray today, you will receive an invoice from [facility]. Please contact [facility] at [phone] with questions or concerns regarding your invoice.   IF you received labwork today, you will  receive an invoice from [employer]. Please contact [facility] at [phone] with questions or concerns regarding your invoice.   Our billing staff will not be able to  assist you with questions regarding bills from these companies.  You will be contacted with the lab results as soon as they are available. The fastest way to get your results is to activate your My Chart account. Instructions are located on the last page of this paperwork. If you have not heard from [place] regarding the results in 2 weeks, please contact this office.         Signed, Meredith Staggers, MD [facility]

## 2019-12-21 NOTE — Patient Instructions (Addendum)
Try melatonin for sleep and see other info on sleep hygiene.  Try drinking water earlier in the day and less before bedtime (1-2 hours before bedtime).  Avoid caffeine in the evening.  Tylenol PM if needed, but if sleep not improving in next month follow up to look into other causes.   No change in medications for now, but I will check the hemoglobin A1c test today.  Potentially we can look at making changes next few weeks if better for cost of medications.  Thank you for coming in today, let me know if there are questions.  Insomnia Insomnia is a sleep disorder that makes it difficult to fall asleep or stay asleep. Insomnia can cause fatigue, low energy, difficulty concentrating, mood swings, and poor performance at work or school. There are three different ways to classify insomnia:  Difficulty falling asleep.  Difficulty staying asleep.  Waking up too early in the morning. Any type of insomnia can be long-term (chronic) or short-term (acute). Both are common. Short-term insomnia usually lasts for three months or less. Chronic insomnia occurs at least three times a week for longer than three months. What are the causes? Insomnia may be caused by another condition, situation, or substance, such as:  Anxiety.  Certain medicines.  Gastroesophageal reflux disease (GERD) or other gastrointestinal conditions.  Asthma or other breathing conditions.  Restless legs syndrome, sleep apnea, or other sleep disorders.  Chronic pain.  Menopause.  Stroke.  Abuse of alcohol, tobacco, or illegal drugs.  Mental health conditions, such as depression.  Caffeine.  Neurological disorders, such as Alzheimer's disease.  An overactive thyroid (hyperthyroidism). Sometimes, the cause of insomnia may not be known. What increases the risk? Risk factors for insomnia include:  Gender. Women are affected more often than men.  Age. Insomnia is more common as you get older.  Stress.  Lack  of exercise.  Irregular work schedule or working night shifts.  Traveling between different time zones.  Certain medical and mental health conditions. What are the signs or symptoms? If you have insomnia, the main symptom is having trouble falling asleep or having trouble staying asleep. This may lead to other symptoms, such as:  Feeling fatigued or having low energy.  Feeling nervous about going to sleep.  Not feeling rested in the morning.  Having trouble concentrating.  Feeling irritable, anxious, or depressed. How is this diagnosed? This condition may be diagnosed based on:  Your symptoms and medical history. Your health care provider may ask about: ? Your sleep habits. ? Any medical conditions you have. ? Your mental health.  A physical exam. How is this treated? Treatment for insomnia depends on the cause. Treatment may focus on treating an underlying condition that is causing insomnia. Treatment may also include:  Medicines to help you sleep.  Counseling or therapy.  Lifestyle adjustments to help you sleep better. Follow these instructions at home: Eating and drinking   Limit or avoid alcohol, caffeinated beverages, and cigarettes, especially close to bedtime. These can disrupt your sleep.  Do not eat a large meal or eat spicy foods right before bedtime. This can lead to digestive discomfort that can make it hard for you to sleep. Sleep habits   Keep a sleep diary to help you and your health care provider figure out what could be causing your insomnia. Write down: ? When you sleep. ? When you wake up during the night. ? How well you sleep. ? How rested you feel the next day. ?  Any side effects of medicines you are taking. ? What you eat and drink.  Make your bedroom a dark, comfortable place where it is easy to fall asleep. ? Put up shades or blackout curtains to block light from outside. ? Use a white noise machine to block noise. ? Keep the  temperature cool.  Limit screen use before bedtime. This includes: ? Watching TV. ? Using your smartphone, tablet, or computer.  Stick to a routine that includes going to bed and waking up at the same times every day and night. This can help you fall asleep faster. Consider making a quiet activity, such as reading, part of your nighttime routine.  Try to avoid taking naps during the day so that you sleep better at night.  Get out of bed if you are still awake after 15 minutes of trying to sleep. Keep the lights down, but try reading or doing a quiet activity. When you feel sleepy, go back to bed. General instructions  Take over-the-counter and prescription medicines only as told by your health care provider.  Exercise regularly, as told by your health care provider. Avoid exercise starting several hours before bedtime.  Use relaxation techniques to manage stress. Ask your health care provider to suggest some techniques that may work well for you. These may include: ? Breathing exercises. ? Routines to release muscle tension. ? Visualizing peaceful scenes.  Make sure that you drive carefully. Avoid driving if you feel very sleepy.  Keep all follow-up visits as told by your health care provider. This is important. Contact a health care provider if:  You are tired throughout the day.  You have trouble in your daily routine due to sleepiness.  You continue to have sleep problems, or your sleep problems get worse. Get help right away if:  You have serious thoughts about hurting yourself or someone else. If you ever feel like you may hurt yourself or others, or have thoughts about taking your own life, get help right away. You can go to your nearest emergency department or call:  Your local emergency services (911 in the [place]  A suicide crisis helpline, such as the [place] at (223)055-8054. This is open 24 hours a day. Summary  Insomnia is a sleep  disorder that makes it difficult to fall asleep or stay asleep.  Insomnia can be long-term (chronic) or short-term (acute).  Treatment for insomnia depends on the cause. Treatment may focus on treating an underlying condition that is causing insomnia.  Keep a sleep diary to help you and your health care provider figure out what could be causing your insomnia. This information is not intended to replace advice given to you by your health care provider. Make sure you discuss any questions you have with your health care provider. Document Released: 12/14/2000 Document Revised: 11/29/2017 Document Reviewed: 09/26/2017 Elsevier Patient Education  [employer].   If you have lab work done today you will be contacted with your lab results within the next 2 weeks.  If you have not heard from [place] then please contact us. The fastest way to get your results is to register for My Chart.   IF you received an x-ray today, you will receive an invoice from [facility]. Please contact [facility] at [phone] with questions or concerns regarding your invoice.   IF you received labwork today, you will receive an invoice from [place]. Please contact [facility] at [phone] with questions or concerns regarding your invoice.  Our billing staff will not be able to assist you with questions regarding bills from these companies.  You will be contacted with the lab results as soon as they are available. The fastest way to get your results is to activate your My Chart account. Instructions are located on the last page of this paperwork. If you have not heard from us regarding the results in 2 weeks, please contact this office.     

## 2019-12-22 LAB — COMPREHENSIVE METABOLIC PANEL
ALT: 18 IU/L (ref 0–44)
AST: 12 IU/L (ref 0–40)
Albumin/Globulin Ratio: 1.8 (ref 1.2–2.2)
Albumin: 4.6 g/dL (ref 4.0–5.0)
Alkaline Phosphatase: 80 IU/L (ref 39–117)
BUN/Creatinine Ratio: 19 (ref 9–20)
BUN: 13 mg/dL (ref 6–24)
Bilirubin Total: 0.3 mg/dL (ref 0.0–1.2)
CO2: 24 mmol/L (ref 20–29)
Calcium: 9.9 mg/dL (ref 8.7–10.2)
Chloride: 98 mmol/L (ref 96–106)
Creatinine, Ser: 0.7 mg/dL — ABNORMAL LOW (ref 0.76–1.27)
GFR calc Af Amer: 135 mL/min/{1.73_m2} (ref >59)
GFR calc non Af Amer: 116 mL/min/{1.73_m2} (ref >59)
Globulin, Total: 2.6 g/dL (ref 1.5–4.5)
Glucose: 200 mg/dL — ABNORMAL HIGH (ref 65–99)
Potassium: 4.7 mmol/L (ref 3.5–5.2)
Sodium: 137 mmol/L (ref 134–144)
Total Protein: 7.2 g/dL (ref 6.0–8.5)

## 2019-12-22 LAB — HEMOGLOBIN A1C
Est. average glucose Bld gHb Est-mCnc: 200 mg/dL
Hgb A1c MFr Bld: 8.6 % — ABNORMAL HIGH (ref 4.8–5.6)

## 2019-12-22 LAB — LIPID PANEL
Chol/HDL Ratio: 3.2 ratio (ref 0.0–5.0)
Cholesterol, Total: 169 mg/dL (ref 100–199)
HDL: 53 mg/dL (ref >39)
LDL Chol Calc (NIH): 92 mg/dL (ref 0–99)
Triglycerides: 135 mg/dL (ref 0–149)
VLDL Cholesterol Cal: 24 mg/dL (ref 5–40)

## 2020-01-18 ENCOUNTER — Other Ambulatory Visit (HOSPITAL_COMMUNITY)
Admission: RE | Admit: 2020-01-18 | Discharge: 2020-01-18 | Disposition: A | Discharge status: Home / Self Care | Payer: 59 | Source: Ambulatory Visit | Attending: Family Medicine | Admitting: Family Medicine

## 2020-01-18 ENCOUNTER — Encounter: Payer: Self-pay | Admitting: Family Medicine

## 2020-01-18 ENCOUNTER — Ambulatory Visit (INDEPENDENT_AMBULATORY_CARE_PROVIDER_SITE_OTHER): Payer: 59 | Admitting: Family Medicine

## 2020-01-18 ENCOUNTER — Other Ambulatory Visit: Payer: Self-pay

## 2020-01-18 VITALS — BP 119/78 | HR 84 | Temp 98.0°F | Ht 69.0 in | Wt 218.0 lb

## 2020-01-18 DIAGNOSIS — R309 Painful micturition, unspecified: Secondary | ICD-10-CM | POA: Insufficient documentation

## 2020-01-18 DIAGNOSIS — N50819 Testicular pain, unspecified: Secondary | ICD-10-CM | POA: Diagnosis not present

## 2020-01-18 DIAGNOSIS — N342 Other urethritis: Secondary | ICD-10-CM | POA: Diagnosis present

## 2020-01-18 LAB — POCT URINALYSIS DIP (MANUAL ENTRY)
Bilirubin, UA: NEGATIVE
Blood, UA: NEGATIVE
Glucose, UA: NEGATIVE mg/dL
Nitrite, UA: NEGATIVE
Protein Ur, POC: NEGATIVE mg/dL
Spec Grav, UA: >=1.03 — AB (ref 1.010–1.025)
Urobilinogen, UA: 0.2 E.U./dL
pH, UA: 5 (ref 5.0–8.0)

## 2020-01-18 MED ORDER — DOXYCYCLINE HYCLATE 100 MG PO TABS: 100.0000 mg | ORAL_TABLET | Freq: Two times a day (BID) | ORAL | 0 refills | Status: DC

## 2020-01-18 NOTE — Patient Instructions (Signed)
Take doxycycline 100 mg 1 twice daily for infection  We will let you know the results of your lab tests  Return if further problems  If the testicular pains get worse we will have to have further testing done we will refer you to a urologist.

## 2020-01-18 NOTE — Progress Notes (Signed)
Patient ID: Dennis Thomas, male    DOB: 04-08-1976  Age: 44 y.o. MRN: [id number]  Chief Complaint  Patient presents with  . painful urination    several months   . painful penis tip  . Testicle Pain    Subjective:   Patient has been having painful urination for the last few days. No discharge. He has 1 sexual partner who has been with for 6 months or more. He has no reason to suspect that she has been with anyone else. He also has a little discomfort down in his testes. A few years ago he had some pain like that and it radiated down his leg, and looking back at the old notes it appears that he was thought to maybe have some lumbar radiculopathy.    Current allergies, medications, problem list, past/family and social histories reviewed.  Objective:  BP 119/78   Pulse 84   Temp 98 F (36.7 C) (Temporal)   Ht 5\' 9"  (1.753 m)   Wt 218 lb (98.9 kg)   SpO2 94%   BMI 32.19 kg/m   No acute distress. Normal male external genitalia. No discharge or lesions noted on the penis. Testes are nontender and scrotum without any masses. No hernias.  Assessment & Plan:   Assessment: 1. Painful urination   2. Urethritis   3. Pain in testicle, unspecified laterality       Plan: I believe he could have an STD, may be a nongonococcal urethritis. See instructions. Chose not to given ceftriaxone but did put him on doxycycline.  Orders Placed This Encounter  Procedures  . Urine Culture  . HIV Antibody (routine testing w rflx)  . RPR  . POCT urinalysis dipstick    Meds ordered this encounter  Medications  . doxycycline (VIBRA-TABS) 100 MG tablet    Sig: Take 1 tablet (100 mg total) by mouth 2 (two) times daily.    Dispense:  20 tablet    Refill:  0         Patient Instructions  Take doxycycline 100 mg 1 twice daily for infection  We will let you know the results of your lab tests  Return if further problems  If the testicular pains get worse we will have to have further  testing done we will refer you to a urologist.    Return if symptoms worsen or fail to improve.   , MD 01/18/2020

## 2020-01-19 LAB — URINE CULTURE: Organism ID, Bacteria: NO GROWTH

## 2020-01-19 LAB — RPR: RPR Ser Ql: NONREACTIVE

## 2020-01-19 LAB — HIV ANTIBODY (ROUTINE TESTING W REFLEX): HIV Screen 4th Generation wRfx: NONREACTIVE

## 2020-01-20 LAB — GC/CHLAMYDIA PROBE AMP (~~LOC~~) NOT AT ARMC
Chlamydia: POSITIVE — AB
Comment: NEGATIVE
Comment: NORMAL
Neisseria Gonorrhea: NEGATIVE

## 2020-01-31 ENCOUNTER — Other Ambulatory Visit: Payer: Self-pay | Admitting: Family Medicine

## 2020-01-31 DIAGNOSIS — E785 Hyperlipidemia, unspecified: Secondary | ICD-10-CM

## 2020-03-21 ENCOUNTER — Ambulatory Visit: Payer: Self-pay | Admitting: Family Medicine

## 2020-04-10 ENCOUNTER — Other Ambulatory Visit: Payer: Self-pay | Admitting: Family Medicine

## 2020-04-10 DIAGNOSIS — E785 Hyperlipidemia, unspecified: Secondary | ICD-10-CM

## 2020-04-10 DIAGNOSIS — E1165 Type 2 diabetes mellitus with hyperglycemia: Secondary | ICD-10-CM

## 2020-04-10 NOTE — Telephone Encounter (Signed)
Requested Prescriptions  Pending Prescriptions Disp Refills  . glipiZIDE (GLUCOTROL) 5 MG tablet [Pharmacy Med Name: GLIPIZIDE 5MG  TABLETS] 180 tablet 0    Sig: TAKE 1 TABLET(5 MG) BY MOUTH TWICE DAILY BEFORE A MEAL     Endocrinology:  Diabetes - Sulfonylureas Failed - 04/10/2020  8:27 AM      Failed - HBA1C is between 0 and 7.9 and within 180 days    Hgb A1c MFr Bld  Date Value Ref Range Status  12/21/2019 8.6 (H) 4.8 - 5.6 % Final    Comment:             Prediabetes: 5.7 - 6.4          Diabetes: >6.4          Glycemic control for adults with diabetes: <7.0          Passed - Valid encounter within last 6 months    Recent Outpatient Visits          2 months ago Painful urination   Primary Care at [facility], [facility], MD   3 months ago Uncontrolled type 2 diabetes mellitus with hyperglycemia [facility])   Primary Care at [facility], Sunday Shams, MD   5 months ago Uncontrolled type 2 diabetes mellitus with hyperglycemia [facility])   Primary Care at [facility], Sunday Shams, MD   6 months ago Uncontrolled type 2 diabetes mellitus with hyperglycemia [facility])   Primary Care at [facility], Sunday Shams, MD   6 months ago Uncontrolled type 2 diabetes mellitus with hyperglycemia [facility])   Primary Care at [facility], Sunday Shams, MD      Future Appointments            In 4 weeks Asencion Partridge Neva Seat, MD Primary Care at [place], [facility]           . atorvastatin (LIPITOR) 10 MG tablet [Pharmacy Med Name: ATORVASTATIN 10MG  TABLETS] 90 tablet 3    Sig: TAKE 1 TABLET(10 MG) BY MOUTH DAILY     Cardiovascular:  Antilipid - Statins Failed - 04/10/2020  8:27 AM      Failed - LDL in normal range and within 360 days    LDL Chol Calc (NIH)  Date Value Ref Range Status  12/21/2019 92 0 - 99 mg/dL Final         Passed - Total Cholesterol in normal range and within 360 days    Cholesterol, Total  Date Value Ref Range Status  12/21/2019 169 100 - 199 mg/dL Final         Passed - HDL in normal range and  within 360 days    HDL  Date Value Ref Range Status  12/21/2019 53 >39 mg/dL Final         Passed - Triglycerides in normal range and within 360 days    Triglycerides  Date Value Ref Range Status  12/21/2019 135 0 - 149 mg/dL Final         Passed - Patient is not pregnant      Passed - Valid encounter within last 12 months    Recent Outpatient Visits          2 months ago Painful urination   Primary Care at [facility], 12/23/2019, MD   3 months ago Uncontrolled type 2 diabetes mellitus with hyperglycemia [facility])   Primary Care at [facility], [facility], MD   5 months ago Uncontrolled type 2 diabetes mellitus with hyperglycemia (HCC)   Primary  Care at [place], MD   6 months ago Uncontrolled type 2 diabetes mellitus with hyperglycemia [facility])   Primary Care at [facility], Ranell Patrick, MD   6 months ago Uncontrolled type 2 diabetes mellitus with hyperglycemia [facility])   Primary Care at [facility], Ranell Patrick, MD      Future Appointments            In 4 weeks Carlota Raspberry Ranell Patrick, MD Primary Care at [place], [facility]

## 2020-05-09 ENCOUNTER — Other Ambulatory Visit (HOSPITAL_COMMUNITY)
Admission: RE | Admit: 2020-05-09 | Discharge: 2020-05-09 | Disposition: A | Discharge status: Home / Self Care | Payer: 59 | Source: Ambulatory Visit | Attending: Family Medicine | Admitting: Family Medicine

## 2020-05-09 ENCOUNTER — Encounter: Payer: Self-pay | Admitting: Family Medicine

## 2020-05-09 ENCOUNTER — Ambulatory Visit (INDEPENDENT_AMBULATORY_CARE_PROVIDER_SITE_OTHER): Payer: 59 | Admitting: Family Medicine

## 2020-05-09 ENCOUNTER — Other Ambulatory Visit: Payer: Self-pay

## 2020-05-09 VITALS — BP 124/79 | HR 77 | Temp 97.6°F | Ht 69.0 in | Wt 209.2 lb

## 2020-05-09 DIAGNOSIS — Z8619 Personal history of other infectious and parasitic diseases: Secondary | ICD-10-CM | POA: Insufficient documentation

## 2020-05-09 DIAGNOSIS — M79604 Pain in right leg: Secondary | ICD-10-CM

## 2020-05-09 DIAGNOSIS — E1165 Type 2 diabetes mellitus with hyperglycemia: Secondary | ICD-10-CM | POA: Diagnosis not present

## 2020-05-09 NOTE — Patient Instructions (Addendum)
   Knee/leg exam overall reassuring.  Try Tylenol over-the-counter, follow-up in 2 weeks if not improving, sooner if worse.  I will check some lab work as well as urine test today.  If diabetes test still elevated, would consider adding new class of medication that can excrete sugar in the urine.  I will let you know once I review your labs.  Continue Metformin and glipizide same doses for now.    If you have lab work done today you will be contacted with your lab results within the next 2 weeks.  If you have not heard from [place] then please contact us. The fastest way to get your results is to register for My Chart.   IF you received an x-ray today, you will receive an invoice from [facility]. Please contact [facility] at [phone] with questions or concerns regarding your invoice.   IF you received labwork today, you will receive an invoice from [place]. Please contact [facility] at [phone] with questions or concerns regarding your invoice.   Our billing staff will not be able to assist you with questions regarding bills from these companies.  You will be contacted with the lab results as soon as they are available. The fastest way to get your results is to activate your My Chart account. Instructions are located on the last page of this paperwork. If you have not heard from [place] regarding the results in 2 weeks, please contact this office.

## 2020-05-09 NOTE — Progress Notes (Signed)
Subjective:  Patient ID: Dennis Thomas, male    DOB: [birth year]  Age: 44 y.o. MRN: [id number]  CC:  Chief Complaint  Patient presents with  . Diabetes    f/u   . recheck on std    test (saw hopper on 01/18/20- make sure it is gone)  . right leg/knee pain    going on for the last 3 weeks     HPI Dennis Thomas presents for   Diabetes: Uncontrolled with complications of hyperglycemia, prior medication nonadherence. Has tolerated 500 mg twice daily of Metformin, diarrhea with higher dosing of 1018m.   Glipizide 5 mg twice daily.  Recommend home monitoring for 6 weeks at December visit with update to decide on additional medicine.  Have not received any further info. Microalbumin: Normal ratio September 28, 2019. Optho, foot exam, pneumovax: Due for ophthalmology exam - has not scheduled - he will call and schedule. He is on statin Lipitor 10 mg daily. Home readings: 160-180 fasting. 190-200 after meals. No symptomatic lows. Ok with SGLT2 if needed.  Fasting with Ramadan.   Lab Results  Component Value Date   HGBA1C 8.6 (H) 12/21/2019   HGBA1C 11.5 (H) 09/28/2019   HGBA1C 13.5 (A) 07/05/2018   Lab Results  Component Value Date   LDLCALC 92 12/21/2019   CREATININE 0.70 (L) 12/21/2019   Chlamydia infection Diagnosed January 18.  Negative gonorrhea, HIV, RPR at that time. Treated with doxycycline. No dysuria/discharge. No genital rash.   R knee pain: 3 weeks, no known injury. No swelling/redness. No rash.  walking for exercise. No new activities.  No locking/giving way. Had Left knee surgery for meniscus issue 3 yrs ago.  Tx: otc pain medicine.   History Patient Active Problem List   Diagnosis Date Noted  . Ventral hernia 02/12/2012  . Uncontrolled diabetes mellitus ([place] 02/03/2012   Past Medical History:  Diagnosis Date  . Diabetes mellitus   . Lower back pain   . Pain in the abdomen   . Steatosis of liver   . Ventral hernia    Past Surgical History:    Procedure Laterality Date  . HERNIA REPAIR  02/20/2012   Ventral Hernia  . VENTRAL HERNIA REPAIR  03/26/2012   Procedure: HERNIA REPAIR VENTRAL ADULT;  Surgeon: MImogene Burn TGeorgette Dover MD;  Location: [facility];  Service: General;  Laterality: N/A;   No Known Allergies Prior to Admission medications   Medication Sig Start Date End Date Taking? Authorizing Provider  atorvastatin (LIPITOR) 10 MG tablet TAKE 1 TABLET(10 MG) BY MOUTH DAILY 04/10/20  Yes Dennis Agreste MD  glipiZIDE (GLUCOTROL) 5 MG tablet TAKE 1 TABLET(5 MG) BY MOUTH TWICE DAILY BEFORE A MEAL 04/10/20  Yes Dennis Agreste MD  metFORMIN (GLUCOPHAGE) 500 MG tablet Take 1 tablet (500 mg total) by mouth 2 (two) times daily with a meal. 11/02/19  Yes Dennis Agreste MD   Social History   Socioeconomic History  . Marital status: Single    Spouse name: Not on file  . Number of children: Not on file  . Years of education: Not on file  . Highest education level: Not on file  Occupational History  . Not on file  Tobacco Use  . Smoking status: Never Smoker  . Smokeless tobacco: Never Used  Substance and Sexual Activity  . Alcohol use: No  . Drug use: No  . Sexual activity: Yes    Comment: married  Other Topics Concern  . Not on file  Social History Narrative  . Not on file   Social Determinants of Health   Financial Resource Strain:   . Difficulty of Paying Living Expenses:   Food Insecurity:   . Worried About Charity fundraiser in the Last Year:   . Arboriculturist in the Last Year:   Transportation Needs:   . Film/video editor (Medical):   Marland Kitchen Lack of Transportation (Non-Medical):   Physical Activity:   . Days of Exercise per Week:   . Minutes of Exercise per Session:   Stress:   . Feeling of Stress :   Social Connections:   . Frequency of Communication with Friends and Family:   . Frequency of Social Gatherings with Friends and Family:   . Attends Religious Services:   . Active Member of Clubs or  Organizations:   . Attends Archivist Meetings:   Marland Kitchen Marital Status:   Intimate Partner Violence:   . Fear of Current or Ex-Partner:   . Emotionally Abused:   Marland Kitchen Physically Abused:   . Sexually Abused:     Review of Systems   Objective:   Vitals:   05/09/20 0933  BP: 124/79  Pulse: 77  Temp: 97.6 F (36.4 C)  TempSrc: Temporal  SpO2: 95%  Weight: 209 lb 3.2 oz (94.9 kg)  Height: _0  (1.753 m)     Physical Exam Vitals reviewed.  Constitutional:      Appearance: He is well-developed.  HENT:     Head: Normocephalic and atraumatic.  Eyes:     Pupils: Pupils are equal, round, and reactive to light.  Neck:     Vascular: No carotid bruit or JVD.  Cardiovascular:     Rate and Rhythm: Normal rate and regular rhythm.     Heart sounds: Normal heart sounds. No murmur.  Pulmonary:     Effort: Pulmonary effort is normal.     Breath sounds: Normal breath sounds. No rales.  Musculoskeletal:     Right knee: No swelling, deformity, erythema, ecchymosis or bony tenderness. Normal range of motion.       Legs:  Skin:    General: Skin is warm and dry.  Neurological:     Mental Status: He is alert and oriented to person, place, and time.        Assessment & Plan:  [name] is a 44 y.o. male . Uncontrolled type 2 diabetes mellitus with hyperglycemia ([place]) - Plan: Hemoglobin A1c, CMP14+EGFR  -Anticipate additional medication to be added, will check A1c, then likely SGLT2.  Continue Metformin, glipizide same doses for now.  History of chlamydia infection - Plan: GC/Chlamydia probe amp ([place])not at [facility]   -Asymptomatic at present, requested repeat testing.  Right leg pain  -Appears to be more at lateral leg and knee.  No known true DVT risk factors, did have some discomfort prior to travel.  No calf swelling.  Possible strain/overuse of everters/penoneals.  Range of motion, stretches, Tylenol over-the-counter with recheck in 2 weeks if persistent.   Sooner if worse.   No orders of the defined types were placed in this encounter.  Patient Instructions     Knee/leg exam overall reassuring.  Try Tylenol over-the-counter, follow-up in 2 weeks if not improving, sooner if worse.  I will check some lab work as well as urine test today.  If diabetes test still elevated, would consider adding new class of medication that can excrete sugar in the urine.  I will let you  know once I review your labs.  Continue Metformin and glipizide same doses for now.    If you have lab work done today you will be contacted with your lab results within the next 2 weeks.  If you have not heard from [place] then please contact us. The fastest way to get your results is to register for My Chart.   IF you received an x-ray today, you will receive an invoice from [facility]. Please contact [facility] Radiology at [phone] with questions or concerns regarding your invoice.   IF you received labwork today, you will receive an invoice from [place]. Please contact [facility] at [phone] with questions or concerns regarding your invoice.   Our billing staff will not be able to assist you with questions regarding bills from these companies.  You will be contacted with the lab results as soon as they are available. The fastest way to get your results is to activate your My Chart account. Instructions are located on the last page of this paperwork. If you have not heard from [place] regarding the results in 2 weeks, please contact this office.          Signed, Merri Ray, MD [facility]

## 2020-05-10 ENCOUNTER — Other Ambulatory Visit: Payer: Self-pay | Admitting: Family Medicine

## 2020-05-10 DIAGNOSIS — E1165 Type 2 diabetes mellitus with hyperglycemia: Secondary | ICD-10-CM

## 2020-05-10 LAB — CMP14+EGFR
ALT: 29 IU/L (ref 0–44)
AST: 20 IU/L (ref 0–40)
Albumin/Globulin Ratio: 1.9 (ref 1.2–2.2)
Albumin: 4.5 g/dL (ref 4.0–5.0)
Alkaline Phosphatase: 79 IU/L (ref 39–117)
BUN/Creatinine Ratio: 32 — ABNORMAL HIGH (ref 9–20)
BUN: 20 mg/dL (ref 6–24)
Bilirubin Total: 0.3 mg/dL (ref 0.0–1.2)
CO2: 21 mmol/L (ref 20–29)
Calcium: 10 mg/dL (ref 8.7–10.2)
Chloride: 100 mmol/L (ref 96–106)
Creatinine, Ser: 0.62 mg/dL — ABNORMAL LOW (ref 0.76–1.27)
GFR calc Af Amer: 140 mL/min/{1.73_m2} (ref >59)
GFR calc non Af Amer: 121 mL/min/{1.73_m2} (ref >59)
Globulin, Total: 2.4 g/dL (ref 1.5–4.5)
Glucose: 312 mg/dL — ABNORMAL HIGH (ref 65–99)
Potassium: 4.2 mmol/L (ref 3.5–5.2)
Sodium: 137 mmol/L (ref 134–144)
Total Protein: 6.9 g/dL (ref 6.0–8.5)

## 2020-05-10 LAB — HEMOGLOBIN A1C
Est. average glucose Bld gHb Est-mCnc: 283 mg/dL
Hgb A1c MFr Bld: 11.5 % — ABNORMAL HIGH (ref 4.8–5.6)

## 2020-05-10 LAB — GC/CHLAMYDIA PROBE AMP (~~LOC~~) NOT AT ARMC
Chlamydia: NEGATIVE
Comment: NEGATIVE
Comment: NORMAL
Neisseria Gonorrhea: NEGATIVE

## 2020-05-10 MED ORDER — JARDIANCE 10 MG PO TABS: 10.0000 mg | ORAL_TABLET | Freq: Every day | ORAL | 1 refills | Status: DC

## 2020-05-23 ENCOUNTER — Ambulatory Visit (INDEPENDENT_AMBULATORY_CARE_PROVIDER_SITE_OTHER): Payer: 59

## 2020-05-23 ENCOUNTER — Other Ambulatory Visit: Payer: Self-pay

## 2020-05-23 ENCOUNTER — Ambulatory Visit (INDEPENDENT_AMBULATORY_CARE_PROVIDER_SITE_OTHER): Payer: 59 | Admitting: Family Medicine

## 2020-05-23 ENCOUNTER — Encounter: Payer: Self-pay | Admitting: Family Medicine

## 2020-05-23 VITALS — BP 105/77 | HR 78 | Temp 97.7°F | Resp 15 | Ht 69.0 in | Wt 212.8 lb

## 2020-05-23 DIAGNOSIS — M25561 Pain in right knee: Secondary | ICD-10-CM

## 2020-05-23 DIAGNOSIS — E1165 Type 2 diabetes mellitus with hyperglycemia: Secondary | ICD-10-CM | POA: Diagnosis not present

## 2020-05-23 LAB — GLUCOSE, POCT (MANUAL RESULT ENTRY): POC Glucose: 230 mg/dl — AB (ref 70–99)

## 2020-05-23 MED ORDER — METFORMIN HCL 850 MG PO TABS: 850.0000 mg | ORAL_TABLET | Freq: Two times a day (BID) | ORAL | 1 refills | Status: AC

## 2020-05-23 NOTE — Patient Instructions (Addendum)
Continue the Jardiance once per day.  Increase the Metformin to 850 mg twice per day.  If diarrhea or new side effects at that dose return to the 500 mg twice per day let me know.  Continue glipizide same dose for now but we can move up on that potentially as well if blood sugar remains elevated on these current changes.  Follow-up with me in 1 month. Sooner If any blood sugars in the 300s or new symptoms see me sooner.   See information below and iliotibial band syndrome as that could be causing some of the upper outer knee pain.  I will check an x-ray as well and let you know if there are concerns.  Tylenol as needed.  Recheck next few weeks if not improving. Return to the clinic or go to the nearest emergency room if any of your symptoms worsen or new symptoms occur.   Iliotibial Band Syndrome Rehab Ask your health care provider which exercises are safe for you. Do exercises exactly as told by your health care provider and adjust them as directed. It is normal to feel mild stretching, pulling, tightness, or discomfort as you do these exercises. Stop right away if you feel sudden pain or your pain gets significantly worse. Do not begin these exercises until told by your health care provider. Stretching and range-of-motion exercises These exercises warm up your muscles and joints and improve the movement and flexibility of your hip and pelvis. Quadriceps stretch, prone  1. Lie on your abdomen on a firm surface, such as a bed or padded floor (prone position). 2. Bend your left / right knee and reach back to hold your ankle or pant leg. If you cannot reach your ankle or pant leg, loop a belt around your foot and grab the belt instead. 3. Gently pull your heel toward your buttocks. Your knee should not slide out to the side. You should feel a stretch in the front of your thigh and knee (quadriceps). 4. Hold this position for __________ seconds. Repeat __________ times. Complete this exercise __________  times a day. Iliotibial band stretch An iliotibial band is a strong band of muscle tissue that runs from the outer side of your hip to the outer side of your thigh and knee. 1. Lie on your side with your left / right leg in the top position. 2. Bend both of your knees and grab your left / right ankle. Stretch out your bottom arm to help you balance. 3. Slowly bring your top knee back so your thigh goes behind your trunk. 4. Slowly lower your top leg toward the floor until you feel a gentle stretch on the outside of your left / right hip and thigh. If you do not feel a stretch and your knee will not fall farther, place the heel of your other foot on top of your knee and pull your knee down toward the floor with your foot. 5. Hold this position for __________ seconds. Repeat __________ times. Complete this exercise __________ times a day. Strengthening exercises These exercises build strength and endurance in your hip and pelvis. Endurance is the ability to use your muscles for a long time, even after they get tired. Straight leg raises, side-lying This exercise strengthens the muscles that rotate the leg at the hip and move it away from your body (hip abductors). 1. Lie on your side with your left / right leg in the top position. Lie so your head, shoulder, hip, and knee line  up. You may bend your bottom knee to help you balance. 2. Roll your hips slightly forward so your hips are stacked directly over each other and your left / right knee is facing forward. 3. Tense the muscles in your outer thigh and lift your top leg 4-6 inches (10-15 cm). 4. Hold this position for __________ seconds. 5. Slowly return to the starting position. Let your muscles relax completely before doing another repetition. Repeat __________ times. Complete this exercise __________ times a day. Leg raises, prone This exercise strengthens the muscles that move the hips (hip extensors). 1. Lie on your abdomen on your bed or a  firm surface. You can put a pillow under your hips if that is more comfortable for your lower back. 2. Bend your left / right knee so your foot is straight up in the air. 3. Squeeze your buttocks muscles and lift your left / right thigh off the bed. Do not let your back arch. 4. Tense your thigh muscle as hard as you can without increasing any knee pain. 5. Hold this position for __________ seconds. 6. Slowly lower your leg to the starting position and allow it to relax completely. Repeat __________ times. Complete this exercise __________ times a day. Hip hike 1. Stand sideways on a bottom step. Stand on your left / right leg with your other foot unsupported next to the step. You can hold on to the railing or wall for balance if needed. 2. Keep your knees straight and your torso square. Then lift your left / right hip up toward the ceiling. 3. Slowly let your left / right hip lower toward the floor, past the starting position. Your foot should get closer to the floor. Do not lean or bend your knees. Repeat __________ times. Complete this exercise __________ times a day. This information is not intended to replace advice given to you by your health care provider. Make sure you discuss any questions you have with your health care provider. Document Revised: 04/09/2019 Document Reviewed: 10/08/2018 Elsevier Patient Education  [employer].    If you have lab work done today you will be contacted with your lab results within the next 2 weeks.  If you have not heard from [place] then please contact us. The fastest way to get your results is to register for My Chart.   IF you received an x-ray today, you will receive an invoice from [facility]. Please contact [facility] Radiology at [phone] with questions or concerns regarding your invoice.   IF you received labwork today, you will receive an invoice from [place]. Please contact [facility] at [phone] with questions or concerns  regarding your invoice.   Our billing staff will not be able to assist you with questions regarding bills from these companies.  You will be contacted with the lab results as soon as they are available. The fastest way to get your results is to activate your My Chart account. Instructions are located on the last page of this paperwork. If you have not heard from [place] regarding the results in 2 weeks, please contact this office.

## 2020-05-23 NOTE — Progress Notes (Signed)
Subjective:  Patient ID: Dennis Thomas, male    DOB: 03/05/1976  Age: 44 y.o. MRN: [id number]  CC:  Chief Complaint  Patient presents with  . Diabetes    pt started jardiance this morning, pt states sugar in the morning is 180-200 the last 4 mornings    HPI [facility] presents for   Diabetes: Uncontrolled with complications of hyperglycemia, prior med nonadherence.  Last visit May 10.  Unable to tolerate higher doses of Metformin, but was taking 500 mg twice daily.  Glipizide 5 mg twice daily.  Added Jardiance 10 mg daily, he is on statin with Lipitor 10 mg daily. Just started jardiance this morning.  Fasting -180 Postprandial 210-220.  No n/v/abd pain.   Lab Results  Component Value Date   HGBA1C 11.5 (H) 05/09/2020   HGBA1C 8.6 (H) 12/21/2019   HGBA1C 11.5 (H) 09/28/2019   Lab Results  Component Value Date   LDLCALC 92 12/21/2019   CREATININE 0.62 (L) 05/09/2020   Right lateral leg pain Discussed 2 weeks ago. Lateral leg/knee.  Thought to be possible overuse or strain of peroneal muscles.  No calf swelling.  Range of motion stretches and Tylenol were discussed.4  Still some soreness - outside of R knee - same. Tylenol Q4h.  History Patient Active Problem List   Diagnosis Date Noted  . Ventral hernia 02/12/2012  . Uncontrolled diabetes mellitus (HCC) 02/03/2012   Past Medical History:  Diagnosis Date  . Diabetes mellitus   . Lower back pain   . Pain in the abdomen   . Steatosis of liver   . Ventral hernia    Past Surgical History:  Procedure Laterality Date  . HERNIA REPAIR  02/20/2012   Ventral Hernia  . VENTRAL HERNIA REPAIR  03/26/2012   Procedure: HERNIA REPAIR VENTRAL ADULT;  Surgeon: Wilmon Arms. Corliss Skains, MD;  Location: [facility];  Service: General;  Laterality: N/A;   No Known Allergies Prior to Admission medications   Medication Sig Start Date End Date Taking? Authorizing Provider  atorvastatin (LIPITOR) 10 MG tablet TAKE 1 TABLET(10 MG) BY MOUTH  DAILY 04/10/20  Yes Shade Flood, MD  empagliflozin (JARDIANCE) 10 MG TABS tablet Take 10 mg by mouth daily before breakfast. 05/10/20  Yes Shade Flood, MD  glipiZIDE (GLUCOTROL) 5 MG tablet TAKE 1 TABLET(5 MG) BY MOUTH TWICE DAILY BEFORE A MEAL 04/10/20  Yes Shade Flood, MD  metFORMIN (GLUCOPHAGE) 500 MG tablet Take 1 tablet (500 mg total) by mouth 2 (two) times daily with a meal. 11/02/19  Yes Shade Flood, MD   Social History   Socioeconomic History  . Marital status: Single    Spouse name: Not on file  . Number of children: Not on file  . Years of education: Not on file  . Highest education level: Not on file  Occupational History  . Not on file  Tobacco Use  . Smoking status: Never Smoker  . Smokeless tobacco: Never Used  Substance and Sexual Activity  . Alcohol use: No  . Drug use: No  . Sexual activity: Yes    Comment: married  Other Topics Concern  . Not on file  Social History Narrative  . Not on file   Social Determinants of Health   Financial Resource Strain:   . Difficulty of Paying Living Expenses:   Food Insecurity:   . Worried About Programme researcher, broadcasting/film/video in the Last Year:   . [employer] of Food in the Last  Year:   Transportation Needs:   . Freight forwarder (Medical):   Marland Kitchen Lack of Transportation (Non-Medical):   Physical Activity:   . Days of Exercise per Week:   . Minutes of Exercise per Session:   Stress:   . Feeling of Stress :   Social Connections:   . Frequency of Communication with Friends and Family:   . Frequency of Social Gatherings with Friends and Family:   . Attends Religious Services:   . Active Member of Clubs or Organizations:   . Attends Banker Meetings:   Marland Kitchen Marital Status:   Intimate Partner Violence:   . Fear of Current or Ex-Partner:   . Emotionally Abused:   Marland Kitchen Physically Abused:   . Sexually Abused:     Review of Systems   Objective:   Vitals:   05/23/20 1013  BP: 105/77  Pulse: 78    Resp: 15  Temp: 97.7 F (36.5 C)  TempSrc: Temporal  SpO2: 97%  Weight: 212 lb 12.8 oz (96.5 kg)  Height: 5\' 9"  (1.753 m)     Physical Exam Vitals reviewed.  Constitutional:      Appearance: He is well-developed.  HENT:     Head: Normocephalic and atraumatic.  Eyes:     Pupils: Pupils are equal, round, and reactive to light.  Neck:     Vascular: No carotid bruit or JVD.  Cardiovascular:     Rate and Rhythm: Normal rate and regular rhythm.     Heart sounds: Normal heart sounds. No murmur.  Pulmonary:     Effort: Pulmonary effort is normal.     Breath sounds: Normal breath sounds. No rales.  Musculoskeletal:     Comments: Right knee, full range of motion, no focal bony tenderness but describes area of tenderness at the distal aspect of the iliotibial band and lateral joint line.  Negative McMurray negative Lachman, no effusion, skin intact.  Slight tight IT band.  Skin:    General: Skin is warm and dry.  Neurological:     Mental Status: He is alert and oriented to person, place, and time.      DG Knee Complete 4 Views Right  Result Date: 05/23/2020 CLINICAL DATA:  Right lateral knee pain EXAM: RIGHT KNEE - COMPLETE 4+ VIEW COMPARISON:  None. FINDINGS: No evidence of fracture, dislocation, or joint effusion. No evidence of arthropathy or other focal bone abnormality. Soft tissues are unremarkable. IMPRESSION: No acute abnormality noted. Electronically Signed   By: 05/25/2020 M.D.   On: 05/23/2020 11:43     Assessment & Plan:  [name] is a 44 y.o. male . Uncontrolled type 2 diabetes mellitus with hyperglycemia (HCC) - Plan: POCT glucose (manual entry), metFORMIN (GLUCOPHAGE) 850 MG tablet  -Uncontrolled.  Potential need for insulin discussed, but will try higher dose of Metformin, watch for return of diarrhea, and if so return to 500 mg dosing.  Also recently started [place].  Monitor home readings, potentially may need higher dose of glipizide or transition to  insulin.  Recheck 1 month, RTC precautions given sooner.  Lateral knee pain, right - Plan: DG Knee Complete 4 Views Right  -Reassuring imaging, based on location may have a component of IT band syndrome.  Overall reassuring exam.  Handout given with RTC precautions.   Meds ordered this encounter  Medications  . metFORMIN (GLUCOPHAGE) 850 MG tablet    Sig: Take 1 tablet (850 mg total) by mouth 2 (two) times daily with a meal.  Dispense:  180 tablet    Refill:  1   Patient Instructions   Continue the Jardiance once per day.  Increase the Metformin to 850 mg twice per day.  If diarrhea or new side effects at that dose return to the 500 mg twice per day let me know.  Continue glipizide same dose for now but we can move up on that potentially as well if blood sugar remains elevated on these current changes.  Follow-up with me in 1 month. Sooner If any blood sugars in the 300s or new symptoms see me sooner.   See information below and iliotibial band syndrome as that could be causing some of the upper outer knee pain.  I will check an x-ray as well and let you know if there are concerns.  Tylenol as needed.  Recheck next few weeks if not improving. Return to the clinic or go to the nearest emergency room if any of your symptoms worsen or new symptoms occur.   Iliotibial Band Syndrome Rehab Ask your health care provider which exercises are safe for you. Do exercises exactly as told by your health care provider and adjust them as directed. It is normal to feel mild stretching, pulling, tightness, or discomfort as you do these exercises. Stop right away if you feel sudden pain or your pain gets significantly worse. Do not begin these exercises until told by your health care provider. Stretching and range-of-motion exercises These exercises warm up your muscles and joints and improve the movement and flexibility of your hip and pelvis. Quadriceps stretch, prone  1. Lie on your abdomen on a firm  surface, such as a bed or padded floor (prone position). 2. Bend your left / right knee and reach back to hold your ankle or pant leg. If you cannot reach your ankle or pant leg, loop a belt around your foot and grab the belt instead. 3. Gently pull your heel toward your buttocks. Your knee should not slide out to the side. You should feel a stretch in the front of your thigh and knee (quadriceps). 4. Hold this position for __________ seconds. Repeat __________ times. Complete this exercise __________ times a day. Iliotibial band stretch An iliotibial band is a strong band of muscle tissue that runs from the outer side of your hip to the outer side of your thigh and knee. 1. Lie on your side with your left / right leg in the top position. 2. Bend both of your knees and grab your left / right ankle. Stretch out your bottom arm to help you balance. 3. Slowly bring your top knee back so your thigh goes behind your trunk. 4. Slowly lower your top leg toward the floor until you feel a gentle stretch on the outside of your left / right hip and thigh. If you do not feel a stretch and your knee will not fall farther, place the heel of your other foot on top of your knee and pull your knee down toward the floor with your foot. 5. Hold this position for __________ seconds. Repeat __________ times. Complete this exercise __________ times a day. Strengthening exercises These exercises build strength and endurance in your hip and pelvis. Endurance is the ability to use your muscles for a long time, even after they get tired. Straight leg raises, side-lying This exercise strengthens the muscles that rotate the leg at the hip and move it away from your body (hip abductors). 1. Lie on your side with your left /  right leg in the top position. Lie so your head, shoulder, hip, and knee line up. You may bend your bottom knee to help you balance. 2. Roll your hips slightly forward so your hips are stacked directly over  each other and your left / right knee is facing forward. 3. Tense the muscles in your outer thigh and lift your top leg 4-6 inches (10-15 cm). 4. Hold this position for __________ seconds. 5. Slowly return to the starting position. Let your muscles relax completely before doing another repetition. Repeat __________ times. Complete this exercise __________ times a day. Leg raises, prone This exercise strengthens the muscles that move the hips (hip extensors). 1. Lie on your abdomen on your bed or a firm surface. You can put a pillow under your hips if that is more comfortable for your lower back. 2. Bend your left / right knee so your foot is straight up in the air. 3. Squeeze your buttocks muscles and lift your left / right thigh off the bed. Do not let your back arch. 4. Tense your thigh muscle as hard as you can without increasing any knee pain. 5. Hold this position for __________ seconds. 6. Slowly lower your leg to the starting position and allow it to relax completely. Repeat __________ times. Complete this exercise __________ times a day. Hip hike 1. Stand sideways on a bottom step. Stand on your left / right leg with your other foot unsupported next to the step. You can hold on to the railing or wall for balance if needed. 2. Keep your knees straight and your torso square. Then lift your left / right hip up toward the ceiling. 3. Slowly let your left / right hip lower toward the floor, past the starting position. Your foot should get closer to the floor. Do not lean or bend your knees. Repeat __________ times. Complete this exercise __________ times a day. This information is not intended to replace advice given to you by your health care provider. Make sure you discuss any questions you have with your health care provider. Document Revised: 04/09/2019 Document Reviewed: 10/08/2018 Elsevier Patient Education  [employer].    If you have lab work done today you will be  contacted with your lab results within the next 2 weeks.  If you have not heard from [place] then please contact us. The fastest way to get your results is to register for My Chart.   IF you received an x-ray today, you will receive an invoice from [facility]. Please contact [facility] at [phone] with questions or concerns regarding your invoice.   IF you received labwork today, you will receive an invoice from [facility]. Please contact [facility] at [phone] with questions or concerns regarding your invoice.   Our billing staff will not be able to assist you with questions regarding bills from these companies.  You will be contacted with the lab results as soon as they are available. The fastest way to get your results is to activate your My Chart account. Instructions are located on the last page of this paperwork. If you have not heard from [place] regarding the results in 2 weeks, please contact this office.         Signed, Meredith Staggers, MD [facility]

## 2020-07-11 ENCOUNTER — Ambulatory Visit: Payer: 59 | Admitting: Family Medicine

## 2020-07-12 ENCOUNTER — Encounter: Payer: Self-pay | Admitting: Family Medicine

## 2020-07-25 ENCOUNTER — Ambulatory Visit: Payer: 59 | Admitting: Family Medicine

## 2020-07-26 ENCOUNTER — Encounter: Payer: Self-pay | Admitting: Family Medicine

## 2020-08-06 ENCOUNTER — Other Ambulatory Visit: Payer: Self-pay | Admitting: Family Medicine

## 2020-08-06 DIAGNOSIS — E1165 Type 2 diabetes mellitus with hyperglycemia: Secondary | ICD-10-CM

## 2020-08-06 NOTE — Telephone Encounter (Signed)
Requested Prescriptions  °Pending Prescriptions Disp Refills  °• JARDIANCE 10 MG TABS tablet [Pharmacy Med Name: JARDIANCE 10MG TABLETS] 90 tablet 0  °  Sig: TAKE 1 TABLET BY MOUTH DAILY BEFORE BREAKFAST  °  ° Endocrinology:  Diabetes - SGLT2 Inhibitors Failed - 08/06/2020  4:41 PM  °  °  Failed - Cr in normal range and within 360 days  °  Creat  °Date Value Ref Range Status  °07/06/2013 0.71 0.50 - 1.35 mg/dL Final  ° °Creatinine, Ser  °Date Value Ref Range Status  °05/09/2020 0.62 (L) 0.76 - 1.27 mg/dL Final  °   °  °  Failed - LDL in normal range and within 360 days  °  LDL Chol Calc (NIH)  °Date Value Ref Range Status  °12/21/2019 92 0 - 99 mg/dL Final  °   °  °  Failed - HBA1C is between 0 and 7.9 and within 180 days  °  Hgb A1c MFr Bld  °Date Value Ref Range Status  °05/09/2020 11.5 (H) 4.8 - 5.6 % Final  °  Comment:  °           Prediabetes: 5.7 - 6.4 °         Diabetes: >6.4 °         Glycemic control for adults with diabetes: <7.0 °  °   °  °  Passed - eGFR in normal range and within 360 days  °  GFR calc Af Amer  °Date Value Ref Range Status  °05/09/2020 140 >59 mL/min/1.73 Final  °  Comment:  °  **[facility] currently reports eGFR in compliance with the current** °  recommendations of the National Kidney Foundation. [facility] will °  update reporting as new guidelines are published from the NKF-ASN °  Task force. °  ° °GFR calc non Af Amer  °Date Value Ref Range Status  °05/09/2020 121 >59 mL/min/1.73 Final  °   °  °  Passed - Valid encounter within last 6 months  °  Recent Outpatient Visits   °      ° 2 months ago Uncontrolled type 2 diabetes mellitus with hyperglycemia (HCC)  ° [facility], Jeffrey R, MD  ° 2 months ago Uncontrolled type 2 diabetes mellitus with hyperglycemia (HCC)  ° [facility], Jeffrey R, MD  ° 6 months ago Painful urination  ° [facility], David H, MD  ° 7 months ago Uncontrolled type 2 diabetes mellitus with hyperglycemia (HCC)  °  [facility], Jeffrey R, MD  ° 9 months ago Uncontrolled type 2 diabetes mellitus with hyperglycemia (HCC)  ° [facility], Jeffrey R, MD  °  °  ° °  °  °  ° ° °

## 2020-11-14 ENCOUNTER — Other Ambulatory Visit: Payer: Self-pay | Admitting: Family Medicine

## 2020-11-14 DIAGNOSIS — E1165 Type 2 diabetes mellitus with hyperglycemia: Secondary | ICD-10-CM

## 2022-08-22 ENCOUNTER — Emergency Department (HOSPITAL_COMMUNITY): Payer: 59

## 2022-08-22 ENCOUNTER — Other Ambulatory Visit: Payer: Self-pay

## 2022-08-22 ENCOUNTER — Emergency Department (HOSPITAL_COMMUNITY)
Admission: EM | Admit: 2022-08-22 | Discharge: 2022-08-23 | Disposition: A | Discharge status: Home / Self Care | Payer: 59 | Attending: Emergency Medicine | Admitting: Emergency Medicine

## 2022-08-22 ENCOUNTER — Encounter (HOSPITAL_COMMUNITY): Payer: Self-pay

## 2022-08-22 DIAGNOSIS — E1165 Type 2 diabetes mellitus with hyperglycemia: Secondary | ICD-10-CM | POA: Insufficient documentation

## 2022-08-22 DIAGNOSIS — M545 Low back pain, unspecified: Secondary | ICD-10-CM | POA: Insufficient documentation

## 2022-08-22 DIAGNOSIS — M25562 Pain in left knee: Secondary | ICD-10-CM | POA: Insufficient documentation

## 2022-08-22 DIAGNOSIS — Y92410 Unspecified street and highway as the place of occurrence of the external cause: Secondary | ICD-10-CM | POA: Insufficient documentation

## 2022-08-22 DIAGNOSIS — M25571 Pain in right ankle and joints of right foot: Secondary | ICD-10-CM | POA: Diagnosis not present

## 2022-08-22 DIAGNOSIS — Z7984 Long term (current) use of oral hypoglycemic drugs: Secondary | ICD-10-CM | POA: Insufficient documentation

## 2022-08-22 DIAGNOSIS — R739 Hyperglycemia, unspecified: Secondary | ICD-10-CM

## 2022-08-22 LAB — CBG MONITORING, ED: Glucose-Capillary: 378 mg/dL — ABNORMAL HIGH (ref 70–99)
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
## 2022-08-23 LAB — CBG MONITORING, ED: Glucose-Capillary: 369 mg/dL — ABNORMAL HIGH (ref 70–99)

## 2022-08-23 MED ORDER — SODIUM CHLORIDE 0.9 % IV BOLUS: 1000.0000 mL | Freq: Once | INTRAVENOUS | Status: DC
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
## 2024-11-07 ENCOUNTER — Emergency Department (HOSPITAL_BASED_OUTPATIENT_CLINIC_OR_DEPARTMENT_OTHER)
Admission: EM | Admit: 2024-11-07 | Discharge: 2024-11-07 | Disposition: A | Discharge status: Home / Self Care | Attending: Emergency Medicine | Admitting: Emergency Medicine

## 2024-11-07 ENCOUNTER — Other Ambulatory Visit: Payer: Self-pay

## 2024-11-07 ENCOUNTER — Encounter (HOSPITAL_BASED_OUTPATIENT_CLINIC_OR_DEPARTMENT_OTHER): Payer: Self-pay

## 2024-11-07 DIAGNOSIS — K432 Incisional hernia without obstruction or gangrene: Secondary | ICD-10-CM | POA: Insufficient documentation

## 2024-11-07 DIAGNOSIS — R1033 Periumbilical pain: Secondary | ICD-10-CM | POA: Diagnosis present

## 2024-11-07 NOTE — ED Triage Notes (Signed)
 Umbilical abd pain onset couple weeks ago and worsened yesterday. Denies nausea or vomiting. Last BM this morning and normal. PMH hernia repair 7-8 years ago.
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
## 2024-11-07 NOTE — Discharge Instructions (Addendum)
 Contact a health care provider if: You get new pain, swelling, or redness near your hernia. You have trouble pooping. Your poop is harder or larger than normal. You have a fever or chills. You have nausea or vomiting. Your hernia can't be pushed in. Get help right away if: You have belly pain that gets worse. Your hernia: Changes in shape or size. Changes color. Feels hard or hurts when you touch it. These symptoms may be an emergency. Call 911 right away.

## 2024-11-07 NOTE — ED Notes (Signed)
 Reviewed AVS/discharge instructions with patient. Time allotted for and all questions answered. Patient is agreeable for d/c and escorted to ED exit by staff.

## 2024-11-20 ENCOUNTER — Ambulatory Visit: Payer: Self-pay | Admitting: Surgery
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
